# Patient Record
Sex: Female | Born: 1985 | Race: White | Hispanic: No | Marital: Single | Smoking: Current every day smoker
Health system: Southern US, Community
[De-identification: ages and names within clinical notes are randomized; demographics above are authoritative.]

## PROBLEM LIST (undated history)

## (undated) DIAGNOSIS — F1910 Other psychoactive substance abuse, uncomplicated: Secondary | ICD-10-CM

## (undated) DIAGNOSIS — B1920 Unspecified viral hepatitis C without hepatic coma: Secondary | ICD-10-CM

## (undated) DIAGNOSIS — K219 Gastro-esophageal reflux disease without esophagitis: Secondary | ICD-10-CM

## (undated) HISTORY — PX: APPENDECTOMY: SHX54

---

## 2012-01-06 ENCOUNTER — Ambulatory Visit (HOSPITAL_BASED_OUTPATIENT_CLINIC_OR_DEPARTMENT_OTHER): Payer: No Typology Code available for payment source | Admitting: Internal Medicine

## 2012-01-06 ENCOUNTER — Encounter (HOSPITAL_BASED_OUTPATIENT_CLINIC_OR_DEPARTMENT_OTHER): Payer: Self-pay | Admitting: Internal Medicine

## 2012-01-06 VITALS — BP 110/70 | HR 76 | Temp 98.0°F | Ht 65.0 in | Wt 168.0 lb

## 2012-01-06 DIAGNOSIS — F1910 Other psychoactive substance abuse, uncomplicated: Secondary | ICD-10-CM

## 2012-01-06 DIAGNOSIS — Z13220 Encounter for screening for lipoid disorders: Secondary | ICD-10-CM

## 2012-01-06 DIAGNOSIS — Z113 Encounter for screening for infections with a predominantly sexual mode of transmission: Secondary | ICD-10-CM

## 2012-01-06 DIAGNOSIS — K219 Gastro-esophageal reflux disease without esophagitis: Secondary | ICD-10-CM

## 2012-01-06 DIAGNOSIS — Z0000 Encounter for general adult medical examination without abnormal findings: Secondary | ICD-10-CM

## 2012-01-06 DIAGNOSIS — F17200 Nicotine dependence, unspecified, uncomplicated: Secondary | ICD-10-CM

## 2012-01-06 DIAGNOSIS — B1920 Unspecified viral hepatitis C without hepatic coma: Secondary | ICD-10-CM

## 2012-01-06 DIAGNOSIS — F1120 Opioid dependence, uncomplicated: Secondary | ICD-10-CM

## 2012-01-06 MED ORDER — BUPROPION HCL ER (SR) 100 MG PO TB12
100.0000 mg | ORAL_TABLET | Freq: Two times a day (BID) | ORAL | Status: DC
Start: 2012-01-06 — End: 2012-10-04

## 2012-01-06 MED ORDER — OMEPRAZOLE 40 MG PO CPDR
40.0000 mg | DELAYED_RELEASE_CAPSULE | Freq: Every day | ORAL | Status: DC
Start: 2012-01-06 — End: 2012-05-16

## 2012-01-06 NOTE — Patient Instructions (Signed)
Tips for healthy living  Please read the following.  As always, you can call our office with any questions.     One of the greatest risks to the health of someone in your age group is motor vehicle accidents.  NEVER text while driving- It's illegal and dangerous. If you need to put your phone in the backseat when you are driving, DO IT!  Talking while driving even with a hands free can be dangerous. Try to avoid it.  You should always wear your seat belt. Never drink and drive or ride in a car where the driver has been drinking. Your life is worth far more than the cost of a cab.     Eat 5-7 servings of fruits and vegetables each day. Try to choose whole grain bread, pasta and brown rice over white bread, pasta and rice. The extra fiber is good for your digestion and your blood sugars and will help you feel full longer. Avoid fast food and fried food. Limit juice- it's just sugar which your body doesn't need.  If you want something sweet, try the actual fruit.     To maintain a healthy weight, you should try to get moderate exercise at least 30 minutes 5 times per week or 150 minutes per week. This can be broken up throughout your day by walking or taking the stairs. Consider small steps like parking farther away when you go to the store or getting off the bus 1 stop early and walking.    To lose weight, most people need to exercise at least 60 minutes 5 days per week. This must be combined with controlling your calorie intake.  Simple changes you can make include eating more fruits and vegetables (at least 5 cups daily), not drinking your calories (this means no soda or fruit juice), and limiting fatty or fried foods and sweets.    For more tips on healthy eating, go to http://www.choosemyplate.gov/.  We also have nutritionists here in the clinic, please ask if you would like a referral to one.    Use alcohol only in moderation. One serving of alcohol is equivalent to 12 oz beer, 5 oz wine, and 1.5 oz of 80 proof  liquor .  Moderate drinking which has low risk for alcohol problems is defined as 1 drink per day in women or 2 in men.  Heavy drinking with risk for alcohol problems in women is greater than 7 drinks per week or 3 drinks per occasion. In men, this is greater than 14 drinks per week of 4 per occasion.  Binge drinking is dangerous. It is defined as 4 or more drinks in a sitting for women and 5 or more drinks in a sitting for men.  Avoid it!    Wear sunscreen, at least SPF 15 but preferably 30 especially when you are in the sun for extended periods.  Also, to avoid aging on your face like wrinkles and dark spots, you should wear SPF 15 daily. Consider a hat.    Vitamin D is important. It comes from supplemented dairy foods as well as sun exposure. It is important for bone health and a number of other bodily functions which the medical community is learning about every day. In [place] in the winter, most of us become vitamin D deficient.  (I'm sure you have noticed the lack of sunlight).     The recommended daily intake of vitamin D is 600 IU. If you take in at least 4   servings of dairy daily, you are probably getting enough.  If not, consider a vitamin D supplement especially in the winter months.    If you bike, always wear a helmet.    Use a Condom. If you have unprotected sex, remember over the counter emergency contraception (plan B) is available at your pharmacy in [place]. The sooner you take it, the better but it can be effective up to 3 days after unprotected sex to prevent pregnancy.  If you have any questions about birth control, please ask Dr. Adoria Kawamoto. If you are planning a pregnancy, please ask her about what you should do to prepare for pregnancy.    Ticks in [place] can carry Lyme Disease. If you are bitten by a tick and aren't sure how long it's been on you or think it might have been on you more than 12 hours, call our office to discuss whether an antibiotic should be prescribed to you to  prevent Lyme Disease.     Make sure you have smoke detectors with adequate batteries in your home. Consider carbon monoxide detectors if you live in an older home.      You should get regular dental care every 6 months. Gum disease is very common and may be associated with a risk for heart disease.     Protect yourself against domestic violence. If you feel threatened by another person at your home, workplace or elsewhere, please let Dr. Jovonta Levit know, or for more information, contact the National Domestic Abuse Hotline (1-800-799- SAFE).

## 2012-01-06 NOTE — Progress Notes (Signed)
Sara Blair is a 26 year old female who presents with the following problems:  Looking to be established care.     Feels she needs antiacid. Notes raging heart burn. Notes that she has been on antiacids in the past. Unsure what the name is. Happens with anything she eats.   No vomiting, No blood in stools. Had normal EGD years ago with H pylori and received treatment. Significantly worse with trigger foods.     Would like to stop smoking. Interested in Wellbutrin.   Has been on it in the past. Some difficulty with sleep. Does note occasional tearfulness, hopeless, No SI. No hx hospitalization.     Hx hep C, dx 2008 in first detox. Genotype 1a. Doesn't know much beyond this. Does note initially had VL 3 million last checked 3k.     Hx nodules on her lungs, CT done in [facility] [place]. Last 1 year ago.       ROS:  Constitutional: no weight loss, fevers  Eyes: no visual disturbance  HEENT: denies HA, swallowing difficulties, tinnitus, sinus congestion  Cor: no chest pains or palps  Pulm: no SOB, cough  GI: no diarrhea, constipation, change in bowel habit, abd pain as above  MSK: no joint pains or swelling  Psych: as above  Neuro: no focal deficits  Skin: no rashes, lesions  All other systems were reviewed and negative      Past medical history/Past surgical History  Noted in Epic    Social History Narrative  Noted in Epic    Family History Narrative  Noted in Epic      Review of Patient's Allergies indicates:  Not on File        Current Outpatient Prescriptions on File Prior to Visit:  buPROPion (WELLBUTRIN SR) 100 MG 12 hr tablet Take 1 tablet by mouth 2 (two) times daily. Disp: 60 tablet Rfl: 3     No current facility-administered medications on file prior to visit.    OBJECTIVE:  BP 110/70   Pulse 76   Temp(Src) 98 F (36.7 C) (Oral)   Ht 5\' 5"  (1.651 m)   Wt 168 lb (76.204 kg)   BMI 27.96 kg/m2   SpO2 99%   LMP 12/29/2011  General: seated comfortable in NAD, alert, pleasant and  cooperative  Eyes: Sclera anicteric.  HEENT: Mucous membranes moist. Neck supple. No cervical lymphadenopathy. OP nonerythematous without lesions. No tonsillar exudates. External ear canal without erythema. TM intact and clear.   Cardiac: Heart regular rate rhythm. Normal S1 S2. Without murmur gallop or rub. No clubbing cyanosis or edema of extremities. DP and PT pulses +2.   Pulm: Patient speaking in full sentences. Lungs clear to auscultation.   Abd: Abdomen soft, some epigastric tendernous, non distended. No masses. No hepatosplenomegaly noted.    Ext: Lower extremities without lesions. No erythema or deformity noted.       A/P:  (V70.0) Routine general medical examination at a health care facility  (primary encounter diagnosis)  Comment: Given healthy living hand out. Discussed key points.    (070.70) Unspecified viral hepatitis C without hepatic coma  Plan: HEPATITIS C BDNA QUAL TO QUANT, HEPATITIS C         VIRUS GENOTYPING, HEPATIC FUNCTION PANEL,         REFERRAL TO INFECTIOUS DISEASES ( INT),         HEPATITIS A AB TOTAL, HEP B SURFACE ANTIBODY,         COLLECTION  VENOUS BLOOD VENIPUNCTURe    (305.90) Polysubstance abuse  (304.00) Opiate dependence  Comment: patient notes active in NA looking for sponsor.   Plan: monitor    (530.81) Esophageal reflux  Comment: will give 8 wk trial of ppi then attempt to transition to H2 blocker.   Plan: omeprazole (PRILOSEC) 40 MG capsule    (305.1) Tobacco use disorder  Comment: discussed preparation to quit. Patient very motivated. Discussed SE from SSRI including length till efficacy, irritability, suicide risk.   Plan: buPROPion (WELLBUTRIN SR) 100 MG 12 hr tablet    (V77.91) Lipid screening  Plan: LIPID PANEL, COLLECTION VENOUS BLOOD         VENIPUNCTURe    (V74.5) Screen for STD (sexually transmitted disease)  Plan: HIV 1 AND 2 PLUS O ANTIBODY, AMPLIFIED GENPROBE        CHLAM/GC, COLLECTION VENOUS BLOOD VENIPUNCTURE    Return to Clinic:  In 1 months for tobacco  cessation follow up or sooner if problems arise.       I have spent 25 minutes in face to face time with this patient/patient proxy of which > 50% was in counseling or coordination of care regarding above issues/Dx. Above physical exam.

## 2012-01-09 LAB — CHLAMYDIA GC NAAT
GENPROBE CHLAMYDIA: NEGATIVE
GENPROBE GC: NEGATIVE

## 2012-01-10 LAB — HEPATITIS C VIRUS GENOTYPING: HEPATITIS C VIRUS GENOTYPE: 3

## 2012-01-12 ENCOUNTER — Telehealth (HOSPITAL_BASED_OUTPATIENT_CLINIC_OR_DEPARTMENT_OTHER): Payer: Self-pay | Admitting: Registered Nurse

## 2012-01-12 NOTE — Progress Notes (Signed)
Sara Blair - Test results, ','<<< Less Detail          Test results,   Sara Blair  MRN: [id number] DOB: 09/17/1985      Pt Home: [phone]      Sent: Thu January 12, 2012 12:29 PM   Entered: [phone]      To: P Shpc Rn'S                               Message      Sara Blair is a 26 year old old female.        Patient's PCP: STAHL,BONNI MD        In case we get disconnected what is the best number to reach you at today:    Other [phone]        Person calling:    Patient (self)        How can I help you today:     Calling checking if test results are back.         Patient's language of care: English        Would you like an interpreter when the nurse calls you back?    NO   01/12/12  1237 pm  Pt called asking for recent  Lab results  Message sent  To PCP

## 2012-01-12 NOTE — Progress Notes (Signed)
Patient called informed of lab results.   Remembers being type 1 a for hcv.   Still pending viral load.

## 2012-01-13 ENCOUNTER — Encounter (HOSPITAL_BASED_OUTPATIENT_CLINIC_OR_DEPARTMENT_OTHER): Payer: Self-pay | Admitting: Internal Medicine

## 2012-01-13 DIAGNOSIS — R918 Other nonspecific abnormal finding of lung field: Secondary | ICD-10-CM | POA: Insufficient documentation

## 2012-01-19 LAB — HEPATITIS B SURFACE ANTIBODY: HEPATITIS B SURFACE ANTIBODY: REACTIVE

## 2012-01-19 LAB — CHG HEPATIC FUNCTION PANEL
ALANINE AMINOTRANSFERASE: 31 IU/L (ref 7–35)
ALBUMIN: 4.6 g/dl (ref 3.4–4.8)
ALKALINE PHOSPHATASE: 52 IU/L (ref 25–106)
ASPARTATE AMINOTRANSFERASE: 30 IU/L (ref 8–34)
BILIRUBIN DIRECT: 0.1 mg/dl (ref 0.0–0.2)
BILIRUBIN TOTAL: 0.6 mg/dl (ref 0.2–1.1)
INDIRECT BILIRUBIN: 0.5 mg/dl (ref 0.2–0.9)
TOTAL PROTEIN: 7.4 g/dl (ref 5.9–7.5)

## 2012-01-19 LAB — CHG LIPID PANEL
Cholesterol: 201 mg/dl — ABNORMAL HIGH (ref 0–200)
HIGH DENSITY LIPOPROTEIN: 52 mg/dl (ref 35–85)
LOW DENSITY LIPOPROTEIN DIRECT: 124 mg/dl — ABNORMAL HIGH (ref 0–100)
RISK FACTOR: 3.9 (ref ?–4.4)
TRIGLYCERIDES: 80 mg/dl (ref 0–150)

## 2012-01-19 LAB — HEPATITIS A ANTIBODY TOTAL: HEPATITIS A ANTIBODY TOTAL: NONREACTIVE

## 2012-01-19 LAB — HEPATITIS C BDNA QUANTITATIVE
HEPATITIS C BDNA QUANT LOG: 3.58 LOG10/ml
HEPATITIS C BDNA QUANTITATIVE: 3798 IU/mL

## 2012-01-19 LAB — HIV 1 AND 2 PLUS O ANTIBODY: HIV 1 AND 2 PLUS O SCREEN: NONREACTIVE

## 2012-01-19 LAB — HEPATITIS C BDNA QUAL TO QUANT: HEPATITIS C ANTIBODY QUAL: POSITIVE — AB

## 2012-02-09 ENCOUNTER — Ambulatory Visit (HOSPITAL_BASED_OUTPATIENT_CLINIC_OR_DEPARTMENT_OTHER): Payer: No Typology Code available for payment source | Admitting: Infectious Diseases

## 2012-02-10 ENCOUNTER — Ambulatory Visit (HOSPITAL_BASED_OUTPATIENT_CLINIC_OR_DEPARTMENT_OTHER): Payer: No Typology Code available for payment source | Admitting: Internal Medicine

## 2012-03-01 ENCOUNTER — Ambulatory Visit (HOSPITAL_BASED_OUTPATIENT_CLINIC_OR_DEPARTMENT_OTHER): Payer: No Typology Code available for payment source | Admitting: Internal Medicine

## 2012-04-22 ENCOUNTER — Emergency Department (HOSPITAL_BASED_OUTPATIENT_CLINIC_OR_DEPARTMENT_OTHER)
Admission: RE | Admit: 2012-04-22 | Disposition: A | Discharge status: Home / Self Care | Payer: Self-pay | Source: Emergency Department | Attending: Emergency Medicine | Admitting: Emergency Medicine

## 2012-04-22 LAB — URINALYSIS
BILIRUBIN, URINE: NEGATIVE
CASTS: NONE SEEN PER LPF
CRYSTALS: NONE SEEN
GLUCOSE, URINE: NEGATIVE MG/DL
KETONE, URINE: NEGATIVE MG/DL
NITRITE, URINE: NEGATIVE
OCCULT BLOOD, URINE: NEGATIVE
PH URINE: 7 (ref 5.0–8.0)
PROTEIN, URINE: NEGATIVE MG/DL
SPECIFIC GRAVITY URINE: 1.01 (ref 1.003–1.035)

## 2012-04-22 LAB — POC URINALYSIS
BILIRUBIN, URINE: NEGATIVE
GLUCOSE,URINE: NEGATIVE
KETONE, URINE: NEGATIVE
NITRITE, URINE: NEGATIVE
OCCULT BLOOD, URINE: NEGATIVE
PH URINE: 7 (ref 5.0–8.0)
PROTEIN, URINE: NEGATIVE
SPECIFIC GRAVITY, URINE: 1.01 (ref 1.003–1.030)
UROBILINOGEN URINE: 0.2 (ref 0.2–1.0)

## 2012-04-22 LAB — URINE PREGNANCY TEST (POINT OF CARE): HCG QUALITATIVE URINE: NEGATIVE

## 2012-04-22 MED ORDER — NITROFURANTOIN MONOHYD MACRO 100 MG PO CAPS
100.00 mg | ORAL_CAPSULE | Freq: Two times a day (BID) | ORAL | Status: AC
Start: 2012-04-22 — End: 2012-04-29

## 2012-04-22 MED ORDER — FLUCONAZOLE 100 MG PO TABS
150.00 mg | ORAL_TABLET | Freq: Once | ORAL | Status: AC
Start: 2012-04-22 — End: 2012-04-22

## 2012-04-22 MED ORDER — PHENAZOPYRIDINE HCL 100 MG PO TABS
100.00 mg | ORAL_TABLET | Freq: Three times a day (TID) | ORAL | Status: AC | PRN
Start: 2012-04-22 — End: 2012-04-29

## 2012-04-22 NOTE — ED Notes (Signed)
Patient Disposition    Patient education for diagnosis, medications and follow-up.  Patient left ED 11:12 PM.  Patient received written instructions as well as prescriptions for Macrobid, Pyridium and Diflucan.  All were reviewed with good understanding.  Interpreter to provide instructions: No    Discharged to: Discharged to home

## 2012-04-22 NOTE — ED Triage Note (Signed)
Urinary symptoms x 1 week. No fevers, denies n/v. States no relief with otc meds

## 2012-04-22 NOTE — ED Notes (Signed)
Report and care to Diane RN

## 2012-04-22 NOTE — ED Provider Notes (Addendum)
ED nursing record was reviewed. Prior records as available electronically through the Epic record were reviewed.    Patient was seen along with Dr. Ane Payment and management was discussed with them.    HPI:    This 26 year old female patient presents to the Emergency Department with chief complaint of urinary tract infection.  Patient states she has had one week of urinary burning, with suprapubic tenderness, and that she has been treating with AZO.  She states it is similar to previous urinary tract infections that she has had.  She denies any fevers or chills.  She denies any vaginal bleeding, however states that she has a white vaginal discharge x2 days.  She states that her last menstrual period was September 3 and was normal.  She states that she is currently sexually active with men in a monogamous relationship sometimes using condoms for prophylaxis.  She has no current concern for STI.  She has no fevers, chills, abdominal pain, nausea, vomiting, diarrhea, constipation, other systemic symptoms or complaints.  Patient did ask for a candidate for a cut on her right thumb.  I looked at the injury, cleaned it, noting a shallow clean skin laceration are dressed the injury with a Steri-Strip and a new Band-Aid.    Past Medical History/Problem list:  No past medical history on file.  Patient Active Problem List:     Unspecified viral hepatitis C without hepatic coma     Opiate dependence     Polysubstance abuse     Tobacco use disorder     Multiple lung nodules        Past Surgical History: No past surgical history on file.      Medications:   No current facility-administered medications on file prior to encounter.  Current Outpatient Prescriptions on File Prior to Encounter:  EXPIRED: buPROPion (WELLBUTRIN SR) 100 MG 12 hr tablet Take 1 tablet by mouth 2 (two) times daily. Disp: 60 tablet Rfl: 3         Social History:   Social History   Marital Status: Single  Spouse Name: N/A    Years of Education: N/A   Number of Children: N/A     Occupational History  None on file     Social History Main Topics   Smoking status: Current Everyday Smoker  1.00 Packs/Day  For 9.00 Years     Types: Cigarettes    Smokeless tobacco:     Alcohol Use: Yes    Comment: currently clean, Etoh up to couple bottle, pint a day.     Drug Use: Yes     Comment: started at age 34 IVDU since then multiple Detoxs. Clean 60 days. prior relapse. Not currently on opiate therapy. opiates, coke mainly.      Sexually Active: Not Currently  Partner(s): Female    Comment: not since feb      Other Topics Concern   None on file     Social History Narrative    Living [facility] half way house.                    Allergies:  Review of Patient's Allergies indicates:   Penicillins             Rash      Physical Exam:  BP 142/72   Pulse 82   Temp(Src) 98.2 F   Resp 18   SpO2 100%   LMP 04/03/2012    GENERAL: Well appearing,  No acute distress, non-toxic.    SKIN:  Warm & Dry, no erythema or rash.  HEAD:  NCAT. Sclerae are anicteric and aninjected, oropharynx is clear with moist mucous membranes. PERRL. EOMI.   LUNGS:  Clear to auscultation bilaterally. No wheezes, rales, rhonchi.   HEART:  RRR.  No murmurs, rubs, or gallops.   ABDOMEN:  Soft, NTND.  No hepatosplenomegaly.  No masses.  No involuntary guarding or rebound.   EXTREMITIES:  No obvious deformities.       Pelvic exam: exam chaperoned by nurse,EGBUS within normal limits,normal cervix without lesions with mild light cottagecheese like discharge., polyps or tenderness,uterus normal size, shape, consistency, no mass or tenderness,adnexa normal in size without mass or tenderness.  GENITOURINARY:  No CVA tenderness.   NEUROLOGIC:  Alert and oriented x4; moves all extremities well; speaking in clear fluent sentences. PSYCHIATRIC:  Appropriate for age, time of day, and situation           ED Course and Medical Decision-making:  This 26 year old female patient who presents to the emergency department with symptoms  of a urinary tract infection times one week, with vaginal discharge x2 days.  Patient has no fevers chills or costovertebral angle tenderness, and no abdominal symptoms.  Her pelvic exam was remarkable for a thick white discharge that was non-malodorous w/ no cervical motion tenderness.  Her urine dip showed trace leuks her urine was sent for culture and sensitivity.  She was treated for her yeast infection Diflucan, and will be treated with Macrobid for her UTI.  She will followup with her primary care physician if her symptoms continue over the next 5 days.  2 return to the emergency department if she experiences any chills, fever, worsening symptoms, or new symptoms.      Condition: Unchanged Stable  Disposition: Home      Diagnosis/Diagnoses:  UTI (lower urinary tract infection)  (primary encounter diagnosis)  Yeast infection    Gilmore Laroche, PA-C    I performed a history and physical examination of the patient and discussed his management with the PA. I reviewed the PA's note and agree with the documented finding and plan of care except as noted below.

## 2012-04-22 NOTE — Discharge Instructions (Signed)
DIAGNOSIS & TREATMENT:  You were seen in a [facility] Emergency Department for Urinary Tracit Infection (UTI) and yeast infection.  TEST RESULTS:   You urine demonstrated infection. A Culture is being sent to our lab to make sure the antibiotics you were prescribed are effective against the bacterial infection you have. If not the hospital will call you with further instructions.    FURTHER CARE:  Take the pyridium as prescribed for the first 3 days. Take the antibiotic and finish out the full prescription even if you are feeling better.    NEW MEDICATIONS:  Macrobid:  This is an antibiotic which is only effective against bacterial infections, not viral. Please take this medication as directed and finish out the entire course of the medication. Failure to do this can cause resistance and not only allow your infection to return but also make it harder to treat the next time.  Diflucan: This is an antifungal medication for the treatment of your yeast infection please take once.  Pyridium (phenazopyridine): This medication is to reduce the pain you are having while urinating. This medication can stain clothes and contact lenses. Please wash you hands thoroughly after handling the pills.    WHEN SHOULD YOU BE SEEN NEXT?  Please call your doctor and be seen with in the next 5-10 days for re-evaluation if symptoms are not improving.    WHEN SHOULD YOU RETURN TO THE ED?  Please return to the emergency room if you have fever that does resolve with tylenol or ibuprofen, you develop flank pain, your pain increases, your symptoms worsen, you get new symptoms or you are unable to schedule follow up care.

## 2012-04-23 LAB — CHLAMYDIA GC NAAT
GENPROBE CHLAMYDIA: NEGATIVE
GENPROBE GC: NEGATIVE

## 2012-04-25 LAB — URINE CULTURE/COLONY COUNT

## 2012-05-16 ENCOUNTER — Ambulatory Visit (HOSPITAL_BASED_OUTPATIENT_CLINIC_OR_DEPARTMENT_OTHER): Payer: No Typology Code available for payment source | Admitting: Internal Medicine

## 2012-05-16 ENCOUNTER — Encounter (HOSPITAL_BASED_OUTPATIENT_CLINIC_OR_DEPARTMENT_OTHER): Payer: Self-pay | Admitting: Internal Medicine

## 2012-05-16 VITALS — BP 120/70 | HR 60 | Temp 98.2°F | Wt 161.0 lb

## 2012-05-16 DIAGNOSIS — F1120 Opioid dependence, uncomplicated: Secondary | ICD-10-CM

## 2012-05-16 DIAGNOSIS — Z309 Encounter for contraceptive management, unspecified: Secondary | ICD-10-CM

## 2012-05-16 DIAGNOSIS — L708 Other acne: Secondary | ICD-10-CM

## 2012-05-16 DIAGNOSIS — R300 Dysuria: Secondary | ICD-10-CM

## 2012-05-16 DIAGNOSIS — K219 Gastro-esophageal reflux disease without esophagitis: Secondary | ICD-10-CM

## 2012-05-16 LAB — URINE DIP (POINT OF CARE)
BILIRUBIN, URINE: NEGATIVE
GLUCOSE, URINE: NEGATIVE mg/dl
KETONE, URINE: NEGATIVE mg/dl
LEUKOCYTE ESTERASE: NEGATIVE
NITRITE, URINE: NEGATIVE
OCCULT BLOOD, URINE: NEGATIVE
PH URINE: 6 (ref 5.0–8.0)
PROTEIN, URINE: NEGATIVE mg/dl (ref 0–15)
SPECIFIC GRAVITY URINE: 1.02 (ref 1.003–1.030)
UROBILINOGEN URINE: 0.2 mg/dl (ref 0.2–1.0)

## 2012-05-16 LAB — URINE PREGNANCY TEST (POINT OF CARE): HCG QUALITATIVE URINE: NEGATIVE

## 2012-05-16 MED ORDER — TRAZODONE HCL 50 MG PO TABS
50.0000 mg | ORAL_TABLET | Freq: Every evening | ORAL | Status: DC
Start: 2012-05-16 — End: 2012-11-22

## 2012-05-16 MED ORDER — OMEPRAZOLE 40 MG PO CPDR
40.0000 mg | DELAYED_RELEASE_CAPSULE | Freq: Every day | ORAL | Status: AC
Start: 2012-05-16 — End: 2012-07-16

## 2012-05-16 MED ORDER — CLINDAMYCIN PHOSPHATE 1 % EX LOTN
TOPICAL_LOTION | Freq: Two times a day (BID) | CUTANEOUS | Status: AC
Start: 2012-05-16 — End: 2013-05-16

## 2012-05-16 NOTE — Progress Notes (Signed)
Sara Blair is a 26 year old female here for evaluation of urinary symptoms, GERD, contraception, substance abuse and acne.      She says just needs refills on antiacid.  Uses tums.   Uses omeprazole daily for her GERD. She says that gets heartburn all the time.  Burps "bile".  Is controlled by using ppi daily.  Has not used for a little while and symptoms are back.       She was treated for UTI less than one month ago.  She did not complete the course of macrobid given at the ED few weeks ago.  She did use for at least 4-5 days. She then started having right lower back pain, which is now resolved.  Sometimes still feels dysuria.  She has a history of frequent UTIs after sexual activity. Now became sexually active with new partner.      She is sexually active with stable partner for past 3 months.  Used OCP before and wants to restart.  She is smoking.  Has cut down to 1/2 ppd or less.  Sometimes gets headaches. No aura.  No vision changes.  No paresthesias.  No personal or family of blood clots.   LMP 04/26/12.     Reports a history of substance abuse.  Recently had a "slip".  Has been going to NA meetings again.  Has tried to use low dose of suboxone from the street to help alleviate her symptoms.  Is feeling positive about her recovery process and feels well connected with her group.  Does not want to go to groups at our site.  Will like help with medication to help her sleep for now.      Wants refills on clindamycin topical for her acne over her back and chest.            Physical Exam   Vitals reviewed.  Constitutional: She is oriented to person, place, and time. She appears well-developed. No distress.   Abdominal: Soft. Normal appearance. There is no tenderness. There is no CVA tenderness.   Neurological: She is alert and oriented to person, place, and time.   Skin:   Few open and closed comedones over anterior upper chest and upper back.  No cystic lesions.     Psychiatric: She has a normal mood and  affect. Her behavior is normal.         ASSESSMENT/PLAN:  (788.1) Dysuria  (primary encounter diagnosis)  Comment: in the setting of recent UTI treated partially.  Her UA is negative today so will send U Cx before starting new antibiotics. Advised to drink lots of fluids for now.  Return instructions provided.  Discussed also post coital antibiotic if UTI is an option if needed.   She will return prn.     Plan: URINE CULTURE/COLONY COUNT    (530.81) Esophageal reflux  Plan: refill omeprazole (PRILOSEC) 40 MG capsule       To take daily for 2 weeks then prn.      (V25.9) Contraception management  Comment: we discussed risks of combo OCP including thromboembolic.  She is smoking,  She is under the age of 89 so using estrogen-progesterone OCP will not be contraindicated.  Will refer to our family counselor to restart combo OCP.    Plan: URINE PREGNANCY TEST (POINT OF CARE)    (304.00) Opiate dependence  Comment: discussed importance of adequate follow up.  She agrees to continue regular meetings.  She declines OAS.  Plan: short term for her insomnia-  traZODone (DESYREL) 50 MG tablet    (706.1) Other acne  Comment: has had rx with topical clindamycin which helps her chest and back acne.  No cystic acne.    Plan: refill clindamycin (CLEOCIN T) 1 % lotion

## 2012-05-17 LAB — URINE CULTURE/COLONY COUNT: URINE CULTURE/COLONY COUNT: NO GROWTH

## 2012-08-03 ENCOUNTER — Ambulatory Visit (HOSPITAL_BASED_OUTPATIENT_CLINIC_OR_DEPARTMENT_OTHER): Payer: No Typology Code available for payment source | Admitting: Internal Medicine

## 2012-08-09 ENCOUNTER — Ambulatory Visit (HOSPITAL_BASED_OUTPATIENT_CLINIC_OR_DEPARTMENT_OTHER): Payer: No Typology Code available for payment source

## 2012-08-09 ENCOUNTER — Encounter (HOSPITAL_BASED_OUTPATIENT_CLINIC_OR_DEPARTMENT_OTHER): Payer: Self-pay

## 2012-08-09 ENCOUNTER — Encounter (HOSPITAL_BASED_OUTPATIENT_CLINIC_OR_DEPARTMENT_OTHER): Payer: Self-pay | Admitting: Internal Medicine

## 2012-08-09 VITALS — BP 102/62

## 2012-08-09 DIAGNOSIS — Z3009 Encounter for other general counseling and advice on contraception: Secondary | ICD-10-CM

## 2012-08-09 LAB — URINE PREGNANCY TEST (POINT OF CARE): HCG QUALITATIVE URINE: NEGATIVE

## 2012-08-09 NOTE — Progress Notes (Signed)
Hi Dr. Eppie Gibson, I saw this patient today at [facility]. She wants to use birth control pills. She used in past and liked it. She has been using only condoms in the past year. Pregnancy test negative. Blood pressure 102/62. She had unprotected sex recently but she is expecting her period on this weekend. She is 27 yo, a smoker but she has been using the E-cigarettes nowadays. I suggested her to wait for her period and start the pills within the 05 days of her period. Use condoms at least in first two weeks when she starts the pills. Could you please give put a prescription for Ortho Tri-Cyclen at [facility] in [place]? Thank you, Bobbe Medico - Family Planning Counselor    Will send in rx for ortho tricyclin lo given patient smoker.

## 2012-08-09 NOTE — Progress Notes (Signed)
Reason for Visit: Patient is here to discuss about birth control    Confidential Visit: no  Safe or Confidential Phone #: [phone]  Preferred Name: no    History:    Changes or New Prescriptions: no  Over the Counter or Herbal Medications: no    Changes to Allergies: no    Substance Use:     Changes to Substance Use: no    GYN:  LMP: 07/13/13  Last sexual activity: 2 days ago  Gravida:  01   Para: 0 SAB: 0    TAB: 01   Ectopic: 0  Last GYN Exam (date/result): 08/2011 with negative result    Since LMP:  Bleeding:  no  Discharge:  No  Cramps:  No  Abdominal Pain: No  STI Exposure: No    Pregnancy Desired: no  Support Systems? Boyfriend  Feelings/Plan:     DV Assessment:  Hx of DV/IP Abuse:  no  Hx of Sexual Assault:  No  Sexual Coercion: No  Birth Control Coercion:No  Received Money, Food, Shelter, or Drugs for Sex: No    Social History:  Home Life: Lives with her boyfriend  Work: Advertising copywriter  School: no    Sexual History:  Current Partner(s): Boyfriend for 06 months  Current Sexual Activity: Female  Vaginal, Oral  Current Method of Contraception:   Programme researcher, broadcasting/film/video (s) - Current Method of Contraception: condoms  Sexual Concerns or Questions: no    History of STIs:    Tested Before?:  yes  Component      Latest Ref Rng 01/06/2012   GENPROBE GC       Negative for Neisseria gonorrhoeae rRNA.   GENPROBE CHLAMYDIA       Negative for Chlamydia trachomatis rRNA.   HEPATITIS C BDNA QUANTITATIVE       3798   HEPATITIS C BDNA QUANT LOG       3.58   HIV 1 AND 2 PLUS O SCREEN      NONREACTIVE NON-REACTIVE   HEPATITIS A AB TOTAL IGG/IGM      NONREACTIVE NON-REACTIVE   HEPATITIS B SURFACE ANTIBODY      NONREACTIVE REACTIVE     Testing Offered: Yes  Testing Accepted: No   -Disc: Transmission (Blood, Semen, Vaginal Fluid and Breastmilk), Risky Behaviors, Non-Risks/Myths,  Seroconversion Period , Result Waiting Time, Possible Results, Feelings Surrounding Possible Results, Plans for Informing of Results, Support Structures and Hep C  Risk.    Education/Counseling:  Contraceptive overview: Risks, Benefits, Warning Signs, Effectiveness, Side Effects, Instructions, Follow Up  Informed Consent signed for:  Oral Contraceptives  STD/HIV Risk Assessment Done  STD/HIV prevention/safer sex discussed  Condoms offered and  Distributed  Emergency Contraception Discussed  Confidentiality  Handouts given    Plan:  Options counseling discussed including parenting, termination, and adoption.   Testing Done: Yes: Pregnancy test  Prescriptions Done: Yes: OCPs  Patient plan: To use OCPs    Follow Up Plan:   Patient came here for birth control. She lives with her boyfriend. They have been together for 06 months.  Patient used Nuvaring in the past for one month and did not liked it. She also used pills for a few years. She has not used any birth control for around one year beside condoms.   We discussed all birth control and patient decided to go back to the pills. She has some acne and she would like some type that could help her with it.  Blood pressure taken today and was  102/62. Pregnancy test performed with negative result. Patient had unprotected sex 02 days ago. I offered Emergency Contraception and she declined. She is expecting her period in the end of this week and according to her she is already having some symptoms on her body about the period.  Suggested her to repeat the pregnancy test or return to the Clinic in case she misses her period. She agreed.  I will send a message to her doctor and ask her to put the prescription for [facility] in [place]. (sent through staff message in Epic)    Bobbe Medico - [facility].

## 2012-08-10 ENCOUNTER — Telehealth (HOSPITAL_BASED_OUTPATIENT_CLINIC_OR_DEPARTMENT_OTHER): Payer: Self-pay | Admitting: Internal Medicine

## 2012-08-10 ENCOUNTER — Telehealth (HOSPITAL_BASED_OUTPATIENT_CLINIC_OR_DEPARTMENT_OTHER): Payer: Self-pay | Admitting: Registered Nurse

## 2012-08-10 MED ORDER — NORETHINDRONE ACET-ETHINYL EST 1-20 MG-MCG PO TABS
1.0000 | ORAL_TABLET | Freq: Every day | ORAL | Status: DC
Start: 2012-08-10 — End: 2012-10-04

## 2012-08-10 MED ORDER — NORGESTIM-ETH ESTRAD TRIPHASIC 0.18/0.215/0.25 MG-25 MCG PO TABS
1.0000 | ORAL_TABLET | Freq: Every day | ORAL | Status: DC
Start: 2012-08-10 — End: 2012-08-10

## 2012-08-10 NOTE — Progress Notes (Signed)
Request from pharmacy for birth control pills  Pt was seen 08/09/12 by family planning   Discussed BC options  Pt requesting to start on oral birth control - details in note 08/09/12    Will forward to PCP to review

## 2012-08-10 NOTE — Progress Notes (Signed)
Ortho Tri Cyclen Lo requires a prior authorization. Please fill out the following information below and route back to the [facility] pool.     Prior authorization request for Ortho Tri Cyclen Lo  Clinical Indication:   Previous medications tried:   Diagnostic testing information:       Prior authorization required for medication: Ortho Tri Cyclen Lo  Membership ID (insurance): [id number]   [employer])     if [employer] , Prescription drug plan: N/A

## 2012-08-10 NOTE — Telephone Encounter (Signed)
Message copied by Almira Bar on Fri Aug 10, 2012 11:43 AM  ------       Message from: Marinus Maw       Created: Fri Aug 10, 2012 11:29 AM       Regarding: birth control       Contact: [phone]                       Sara Blair [id number], 27 year old, female, Telephone Information:       Home Phone      [phone]       Work Phone      Not on file.       Mobile          [phone]                     Patient's Preferred Pharmacy:               [id number], [place] - [address] AT [facility] [address]       Phone: [phone] Fax: [phone]                     CONFIRMED TODAY: Yes              CALL BACK NUMBER: [phone]       Best time to call back: anytime       Cell phone:        Other phone:              Available times:              Patient's language of care: English              Patient does not need an interpreter.              Patient's PCP: Alton Revere MD              Person calling on behalf of patient: Pharmacy Joyice Faster)              Calls today for med refill(s). OBGYN dr sent pcp order for birth control- not yet in med list- pharmacy calling for a request                     -  ------

## 2012-08-10 NOTE — Progress Notes (Signed)
Spoke with pt  Informed of script sent  Educated to start the Sunday after she starts her period (started period today so will start (08/12/12)  Educated to take at the same time every day  Educated to use another form of birth control for the first 7 days  Pt verbalized understanding and agrees with plan

## 2012-08-10 NOTE — Progress Notes (Signed)
rx sent this AM. Discussed Ortho tri cyclin with family planning... Sent Ortho tri cyclin lo given patient a smoker and somewhat less clotting risk with lower dose estrogen.

## 2012-08-10 NOTE — Progress Notes (Signed)
[facility] pharmacy called stating that Rx for ORTHO TRI-CYCLEN LO needs Prior Authorization sent to patient's insurance.  Prior authorization request for ORTHO TRI-CYCLEN LO  Clinical Indication:   Previous medications tried:  Diagnostic testing information:

## 2012-08-10 NOTE — Progress Notes (Signed)
Called pt to inform of script sent and to educate on how to take  LM for call back

## 2012-08-10 NOTE — Progress Notes (Signed)
Discussed with pharmacy. Insurance will cover generic. Microgestin. RX sent.

## 2012-08-10 NOTE — Telephone Encounter (Signed)
Message copied by Almira Bar on Fri Aug 10, 2012  1:02 PM  ------       Message from: Otilio Saber       Created: Fri Aug 10, 2012  1:00 PM       Regarding: Returning nurse's phone call.       Contact: [phone]         Bethsaida Siegenthaler is a 27 year old old female.              Patient's PCP: STAHL,BONNI MD              In case we get disconnected what is the best number to reach you at today:       Home Phone [phone] (home)              Person calling:       Patient (self)              How can I help you today:        Other Returning nurse's phone call.               Patient's language of care: English              Would you like an interpreter when the nurse calls you back?       NO         ------

## 2012-08-11 ENCOUNTER — Telehealth (HOSPITAL_BASED_OUTPATIENT_CLINIC_OR_DEPARTMENT_OTHER): Payer: Self-pay | Admitting: Internal Medicine

## 2012-08-11 NOTE — Progress Notes (Signed)
Prior authorization request for ortho-tri-cyclen lo tabs  Clinical Indication:  Previous medications tried:   Diagnostic testing information:

## 2012-08-13 NOTE — Progress Notes (Signed)
Dr Eppie Gibson prescribed alternative medication which is covered under patients insurance .

## 2012-10-04 ENCOUNTER — Encounter (HOSPITAL_BASED_OUTPATIENT_CLINIC_OR_DEPARTMENT_OTHER): Payer: Self-pay | Admitting: Internal Medicine

## 2012-10-04 ENCOUNTER — Ambulatory Visit (HOSPITAL_BASED_OUTPATIENT_CLINIC_OR_DEPARTMENT_OTHER): Payer: No Typology Code available for payment source | Admitting: Internal Medicine

## 2012-10-04 VITALS — BP 118/76 | HR 76 | Temp 98.1°F | Resp 18 | Wt 157.0 lb

## 2012-10-04 DIAGNOSIS — L708 Other acne: Secondary | ICD-10-CM

## 2012-10-04 DIAGNOSIS — J329 Chronic sinusitis, unspecified: Secondary | ICD-10-CM

## 2012-10-04 DIAGNOSIS — F17200 Nicotine dependence, unspecified, uncomplicated: Secondary | ICD-10-CM

## 2012-10-04 DIAGNOSIS — K219 Gastro-esophageal reflux disease without esophagitis: Secondary | ICD-10-CM

## 2012-10-04 DIAGNOSIS — B1920 Unspecified viral hepatitis C without hepatic coma: Secondary | ICD-10-CM

## 2012-10-04 MED ORDER — DESOGESTREL-ETHINYL ESTRADIOL 0.15-30 MG-MCG PO TABS
1.0000 | ORAL_TABLET | Freq: Every day | ORAL | Status: DC
Start: 2012-10-04 — End: 2012-10-31

## 2012-10-04 MED ORDER — FLUTICASONE PROPIONATE 50 MCG/ACT NA SUSP
1.0000 | Freq: Every day | NASAL | Status: DC
Start: 2012-10-04 — End: 2012-11-22

## 2012-10-04 MED ORDER — RANITIDINE HCL 150 MG PO CAPS
150.00 mg | ORAL_CAPSULE | Freq: Two times a day (BID) | ORAL | Status: AC
Start: 2012-10-04 — End: 2013-02-03

## 2012-10-04 MED ORDER — BUPROPION HCL ER (SR) 100 MG PO TB12
100.0000 mg | ORAL_TABLET | Freq: Two times a day (BID) | ORAL | Status: DC
Start: 2012-10-04 — End: 2013-02-15

## 2012-10-04 NOTE — Progress Notes (Signed)
Sara Blair is a 27 year old female who presents with the following problems:    [place]oved into the city.     Works at [employer], Education officer, environmental.     Notes that in stable relationship, needs OCP's. Since starting OCP 2 months ago has had difficulty with acne.   Taken low dose combo pills in the past.     Notes headache behind eye. Notes taking sudafed without relief and saline nose spray.   No fevers.   No cough.   Since last week. Intermittent.   Notes just one cold earlier this year.     Ran out of omeprazole recently and had aweful GERD. Used Omeprazole on and off through out the years. Scope years ago as a teenager with gastritis.    ROS:  Constitutional: no weight loss, fevers  HEENT: as bove  GI: no diarrhea, constipation, change in bowel habit, abd pain  Psych: stable      Past medical history/Past surgical History  Noted in Epic    Social History Narrative  Noted in Epic    Family History Narrative  Noted in Epic      Review of Patient's Allergies indicates:   Penicillins             Rash        Current Outpatient Prescriptions on File Prior to Visit:  buPROPion (WELLBUTRIN SR) 100 MG 12 hr tablet Take 1 tablet by mouth 2 (two) times daily. Disp: 60 tablet Rfl: 3   traZODone (DESYREL) 50 MG tablet Take 1 tablet by mouth nightly. Disp: 30 tablet Rfl: 0   clindamycin (CLEOCIN T) 1 % lotion Apply  topically 2 (two) times daily. Disp: 28 mL Rfl: 6     No current facility-administered medications on file prior to visit.    OBJECTIVE:  BP 118/76   Pulse 76   Temp(Src) 98.1 F (36.7 C) (Oral)   Resp 18   Wt 157 lb (71.215 kg)   BMI 26.13 kg/m2   SpO2 98%   LMP 09/27/2012  General: seated comfortable in NAD, alert, pleasant and cooperative  Eyes: Sclera anicteric.  HEENT: Mucous membranes moist. Neck supple. No cervical lymphadenopathy. OP erythematous without lesions.  Cardiac: Heart regular rate rhythm. Normal S1 S2. Without murmur gallop or rub. No clubbing cyanosis or edema of extremities.   Pulm: Patient  speaking in full sentences. Lungs clear to auscultation.   Abd: Abdomen soft, non tender, non distended. No masses. No hepatosplenomegaly noted.    Skin: Warm, dry and scattered cystic acne on chin.          A/P:  (070.70) Unspecified viral hepatitis C without hepatic coma  (primary encounter diagnosis)  Comment: type 3 will re to ID, possible treatment  Plan: REFERRAL TO INFECTIOUS DISEASES ( INT)    (305.1) Tobacco use disorder  Comment: still using. Continue to address.  Plan: buPROPion (WELLBUTRIN SR) 100 MG 12 hr tablet    (706.1) Other acne  Comment:will try diff progesterone component for better acne control.  Plan: desogestrel-ethinyl estradiol (APRI,         ORTHO-CEPT) 0.15-30 MG-MCG per tablet    (473.9) Unspecified sinusitis (chronic)  Comment: will call if not improved may need abx.  Plan: fluticasone (FLONASE) 50 MCG/ACT nasal spray         (530.81) Esophageal reflux  Comment: likely exacerbated in the setting of ETOH use. Will try taper off PPI.  Plan: ranitidine (ZANTAC) 150 MG capsule  Return to Clinic:  In 3 months, or sooner if problems arise.

## 2012-10-18 ENCOUNTER — Telehealth (HOSPITAL_BASED_OUTPATIENT_CLINIC_OR_DEPARTMENT_OTHER): Payer: Self-pay | Admitting: Internal Medicine

## 2012-10-18 ENCOUNTER — Telehealth (HOSPITAL_BASED_OUTPATIENT_CLINIC_OR_DEPARTMENT_OTHER): Payer: Self-pay

## 2012-10-18 NOTE — Progress Notes (Signed)
Spoke with Luvenia Redden, difficulty with trinessa and Beverlee Nims is the high estrogen componant and given patient is smoking the clotting risk is significant for her.     Will attempt PA for Ortho lo and fup. Patient to continue on desogen for now.

## 2012-10-18 NOTE — Progress Notes (Signed)
Informed pharmacy that Ortho Tri Cyclen LO has been D/C'd   Called in script for APri that was not received on 10/04/2012   Script will be filled as generic equivalent Desogen   No new prescription needed at this time

## 2012-10-18 NOTE — Telephone Encounter (Signed)
Message copied by Sharlyne Pacas on Thu Oct 18, 2012  2:44 PM  ------       Message from: [place]       Created: Thu Oct 18, 2012  2:26 PM       Regarding: Pharmacy-questions regarding medication       Contact: [phone]                       Milady Fleener [id number], 27 year old, female, Telephone Information:       Home Phone      [phone]       Work Phone      Not on file.       Mobile          [phone]                     Patient's Preferred Pharmacy:               [id number] - [place], [place] - [address] AT [facility] [address]       Phone: [phone] Fax: [phone]                     CONFIRMED TODAY: Yes              CALL BACK NUMBER:[phone]              Patient's language of care: English              Patient does not need an interpreter.              Patient's PCP: Alton Revere MD              Person calling on behalf of patient: [facility]              Calls today with questions and concerns.              Pharmacy is calling wants to speak to Rn regarding Birth control.  ------

## 2012-10-18 NOTE — Progress Notes (Signed)
[facility] INTERNAL MED    Person calling on behalf of patient: Pharmacy    May list multiple medications in this section    Medicine Name: ortho-tri-cyclen lo tabs    Dosage:     Frequency (how many pills, how many times a day): take 1 tablet daily by mouth    Number of pills left: none    Documented patient preferred pharmacies:   [id number] - [place], [place] - [address] AT [facility] [address]  Phone: [phone] Fax: [phone]

## 2012-10-18 NOTE — Progress Notes (Addendum)
Call to pharmacist Gabby,desogen patient is presently ordered for has higher risk of clotting and patient is smoker-pharmacist concerned  A few generic options similar to orthotrycyclen lo are-  trinessa or mononessa and they have progesterone component-runs lower risk of clotting but good for acne which is what patient wants  To provider

## 2012-10-18 NOTE — Progress Notes (Signed)
Ortho Tri-Cyclen Lo requires prior authorization through the patients insurance. Please fill out the following information below and send back to the [employer].    Prior authorization request for Ortho Tri-Cyclen Lo  Clinical Indication:   Previous medications tried:   Diagnostic testing information:       Patient has [employer] ([employer] [phone]) for prescription coverage.  I.D.# [id number]

## 2012-10-18 NOTE — Progress Notes (Addendum)
Call to pharmacy: pt broke out with Desogen and is requesting alternative per pharmacist.  Ortho Tri Cyclen Lo requires PA.

## 2012-10-18 NOTE — Progress Notes (Signed)
Informed pharmacy that Ortho Tri Cyclen LO has been D/C'd   Called in script for APri that was not received on 10/04/2012   Script will be filled as generic equivalent Desogen   No new prescription needed at this time

## 2012-10-18 NOTE — Telephone Encounter (Signed)
Message copied by Ledell Peoples on Thu Oct 18, 2012 12:32 PM  ------       Message from: Riccardo Dubin       Created: Thu Oct 18, 2012 12:00 PM       Regarding: speak with the nurse again       Contact: [phone]         Sara Blair is a 27 year old old female.              Patient's PCP: STAHL,BONNI MD              In case we get disconnected what is the best number to reach you at today:       Other: [phone]              Person calling:       Andrey Campanile from [facility] pharmacy              How can I help you today:        York Spaniel that spoke with the nurse before, is requesting to speak with the nurse again.              Patient's language of care: English              Would you like an interpreter when the nurse calls you back?       NO         ------

## 2012-10-19 NOTE — Progress Notes (Signed)
Patient has Psychologist, educational for prescription coverage.  ID #: [id number]    Filled out Medication Prior Authorization Request Form for Ortho Tri-Cyclen Lo. Scanned form into Epic under Careers information officer (PA for Ortho Tri-Cyclen Lo).      Please print out form to be reviewed, signed, and faxed to the insurance company.  After faxing to the insurance company, please scan signed form into Media Manager to keep on record.

## 2012-10-19 NOTE — Progress Notes (Signed)
Prior authorization request for Ortho Tri-Cyclen Lo   Clinical Indication: contraception, acne  Previous medications tried: Desogen, microgestin  Diagnostic testing information: Looking for OCP that will help to control patients acne. Noted significant worsening of acne on microgestin, Patient is a smoker and is at higher risk for blood clots taking desogen given type of progesterone. Also do not want to put her on a high dose estrogen tablet do to clotting risk.

## 2012-10-25 NOTE — Progress Notes (Signed)
Called [employer] to check on the status of the Ortho Tri Cyclen Lo prior authorization request    The insurance company has not received a prior authorization request form for this medication.    Please print out the form that was scanned into Media Manager to be reviewed, signed, and faxed to the insurance company.

## 2012-10-31 MED ORDER — NORGESTIM-ETH ESTRAD TRIPHASIC 0.18/0.215/0.25 MG-25 MCG PO TABS
1.0000 | ORAL_TABLET | Freq: Every day | ORAL | Status: DC
Start: 2012-10-31 — End: 2013-11-04

## 2012-10-31 NOTE — Progress Notes (Signed)
Prior Authorization for Ortho Tri-Cylen LO has been APPROVED.  Insurance: [employer]  ID#: [id number]  PA # [id number] has been approved for lifetime of patient's plan.    **This medication is listed in Epic as D/C'd and Apri has been prescribed in place of it. If patient is to be on this medication please send new prescription to patient's preferred pharmacy**    Approval notice will be scanned into Media Manager.

## 2012-10-31 NOTE — Progress Notes (Signed)
rx sent

## 2012-11-14 ENCOUNTER — Ambulatory Visit (HOSPITAL_BASED_OUTPATIENT_CLINIC_OR_DEPARTMENT_OTHER): Payer: No Typology Code available for payment source | Admitting: Infectious Diseases

## 2012-11-22 ENCOUNTER — Ambulatory Visit (HOSPITAL_BASED_OUTPATIENT_CLINIC_OR_DEPARTMENT_OTHER): Payer: No Typology Code available for payment source | Admitting: Internal Medicine

## 2012-11-22 ENCOUNTER — Encounter (HOSPITAL_BASED_OUTPATIENT_CLINIC_OR_DEPARTMENT_OTHER): Payer: Self-pay | Admitting: Internal Medicine

## 2012-11-22 VITALS — BP 110/70 | HR 66 | Temp 98.0°F | Resp 18 | Wt 160.0 lb

## 2012-11-22 DIAGNOSIS — L708 Other acne: Secondary | ICD-10-CM

## 2012-11-22 DIAGNOSIS — M25569 Pain in unspecified knee: Secondary | ICD-10-CM

## 2012-11-22 MED ORDER — BENZOYL PEROXIDE 5 % EX LIQD
Freq: Two times a day (BID) | CUTANEOUS | Status: DC
Start: 2012-11-22 — End: 2013-12-13

## 2012-11-22 MED ORDER — NAPROXEN 500 MG PO TABS
500.0000 mg | ORAL_TABLET | Freq: Two times a day (BID) | ORAL | Status: DC
Start: 2012-11-22 — End: 2014-02-27

## 2012-11-22 NOTE — Progress Notes (Signed)
Sara Blair is a 27 year old female who presents with the following problems:  Chest and back acne. Recenetly started on ortho tri cyclin lo on med for almost 3 weeks. Acne started while she started microgestin. Has been off for the past 2-3 mo's. None prior to this.   Using benzyl peroxide, clindamycin. X 2 weeks.     Left knee pain. Anterior and lateral.   Using icy hot heating pad. No known injury. No redness, heat or swelling. Getting gradually worse over the past 2 months. Started as sharp shooting pain that then quickly goes away on its own. Pain worst after rising from sitting. Not noted going up or down stairs. No change in daily routine. Works as a Engineer, water. Going to the gym the past couple months. Bike and abd machine going into 2nd month. Now pain in the back of the knee as well lasting minutes. Anterior knee pain on right side. Much less. Not taken any meds.       ROS:  Constitutional: no fevers  MSK: as above  Neuro: no focal deficits      Past medical history/Past surgical History  Noted in Epic    Social History Narrative  Noted in Epic    Family History Narrative  Noted in Epic      Review of Patient's Allergies indicates:   Penicillins             Rash        Current Outpatient Prescriptions on File Prior to Visit:  norgestimate-ethinyl estradiol triphasic (ORTHO TRI-CYCLEN LO) 0.18/0.215/0.25 MG-25 MCG per tablet Take 1 tablet by mouth daily. Disp: 28 tablet Rfl: 11   ranitidine (ZANTAC) 150 MG capsule Take 1 capsule by mouth 2 (two) times daily. Disp: 60 capsule Rfl: 6   buPROPion (WELLBUTRIN SR) 100 MG 12 hr tablet Take 1 tablet by mouth 2 (two) times daily. Disp: 60 tablet Rfl: 3   clindamycin (CLEOCIN T) 1 % lotion Apply  topically 2 (two) times daily. Disp: 28 mL Rfl: 6     No current facility-administered medications on file prior to visit.    OBJECTIVE:  BP 110/70   Pulse 66   Temp(Src) 98 F (36.7 C) (Oral)   Resp 18   Wt 160 lb (72.576 kg)   BMI 26.63 kg/m2   SpO2 98%   LMP  10/23/2012  General: seated comfortable in NAD, alert, pleasant and cooperative  Eyes: Sclera anicteric.  HEENT: Mucous membranes moist.   Skin: Warm, dry comedones scattered over face, chest and back. occational cystic component on chin. Somewhat inflammatory on chest and back.   Cardiac: Heart regular rate rhythm. Normal S1 S2. Without murmur gallop or rub. No clubbing cyanosis or edema of extremities. DP and PT pulses +2.   Pulm: Patient speaking in full sentences. Lungs clear to auscultation.   Ext: Lower extremities without lesions. No erythema or deformity noted. Full range of motion without point tenderness over B knees. Both knees stable with pain on patellar subluxation R>L        A/P:  (719.46) Anterior knee pain  Comment: Given hx and exam suspect patellar subluxation. Fits given recent return to activity. Will give short course NSAID, PT referral, fup 2 months.   Plan: naproxen (NAPROSYN) 500 MG tablet, REFERRAL TO         PHYSICAL THERAPY ( INT)    (706.1) Other acne  Comment: refill BP. Will give more time on new OCP. Fup 2 mo to  consider oral abx therapy.   Plan: benzoyl peroxide (BENZOYL PEROXIDE) 5 %         external liquid

## 2012-11-28 DIAGNOSIS — L708 Other acne: Secondary | ICD-10-CM | POA: Insufficient documentation

## 2012-12-05 ENCOUNTER — Ambulatory Visit (HOSPITAL_BASED_OUTPATIENT_CLINIC_OR_DEPARTMENT_OTHER): Payer: No Typology Code available for payment source | Admitting: Infectious Diseases

## 2012-12-05 ENCOUNTER — Encounter (HOSPITAL_BASED_OUTPATIENT_CLINIC_OR_DEPARTMENT_OTHER): Payer: Self-pay | Admitting: Infectious Diseases

## 2012-12-05 VITALS — BP 113/64 | HR 75 | Temp 98.0°F | Wt 160.0 lb

## 2012-12-05 DIAGNOSIS — F1910 Other psychoactive substance abuse, uncomplicated: Secondary | ICD-10-CM

## 2012-12-05 DIAGNOSIS — B1920 Unspecified viral hepatitis C without hepatic coma: Secondary | ICD-10-CM

## 2012-12-05 LAB — CBC WITH PLATELET
HEMATOCRIT: 37.3 % (ref 34.1–44.9)
HEMOGLOBIN: 12.7 g/dL (ref 11.2–15.7)
MEAN CORP HGB CONC: 34 g/dL (ref 31.0–37.0)
MEAN CORPUSCULAR HGB: 31 pg (ref 26.0–34.0)
MEAN CORPUSCULAR VOL: 91 fL (ref 80.0–100.0)
MEAN PLATELET VOLUME: 10.3 fL (ref 8.7–12.5)
PLATELET COUNT: 212 10*3/uL (ref 150–400)
RBC DISTRIBUTION WIDTH STD DEV: 42.5 fL (ref 35.1–46.3)
RBC DISTRIBUTION WIDTH: 12.6 % (ref 11.5–14.3)
RED BLOOD CELL COUNT: 4.1 M/uL (ref 3.90–5.20)
WHITE BLOOD CELL COUNT: 3.5 10*3/uL — ABNORMAL LOW (ref 4.0–11.0)

## 2012-12-05 LAB — HEPATIC FUNCTION PANEL
ALANINE AMINOTRANSFERASE: 293 U/L — ABNORMAL HIGH (ref 12–45)
ALBUMIN: 4 g/dL (ref 3.4–5.0)
ALKALINE PHOSPHATASE: 59 U/L (ref 45–117)
ASPARTATE AMINOTRANSFERASE: 157 U/L — ABNORMAL HIGH (ref 8–34)
BILIRUBIN DIRECT: 0.1 mg/dl (ref 0.0–0.2)
BILIRUBIN TOTAL: 0.3 mg/dL (ref 0.2–1.0)
INDIRECT BILIRUBIN: 0.2 mg/dL (ref 0.2–0.9)
TOTAL PROTEIN: 7.2 g/dL (ref 6.4–8.2)

## 2012-12-05 LAB — ALPHA-FETOPROTEIN TUMOR MARKER: ALPHA-FETOPROTEIN TUMOR MARKER: 4.8 ng/mL (ref 0.0–8.0)

## 2012-12-05 LAB — CREATININE: CREATININE: 0.5 mg/dL (ref 0.4–1.2)

## 2012-12-05 LAB — BUN (UREA NITROGEN): BUN (UREA NITROGEN): 12 mg/dL (ref 7–18)

## 2012-12-05 LAB — PROTHROMBIN TIME
INR: <1 — ABNORMAL LOW (ref 2.0–3.5)
PROTHROMBIN TIME: 10 SECONDS (ref 9.5–11.5)

## 2012-12-05 NOTE — Progress Notes (Signed)
[facility]  ID:  Sara Blair is a 27 year old woman referred for evaluation and treatment of HCV infection  PCP: STAHL,BONNI MD    HISTORY OF PRESENT ILLNESS  Date of diagnosis: 2007  Risk factor:  IVDU  Estimated date of acquisition: First needle use 2006  HCV Genotype: 3 in 6/13 (1a, per patient hx:  "I swear that is what they said")  HCV Fibrosis Stage: no record in EPIC  HCV Treatment History: no  HCV viral load: 32798 in 6/13  HIV status: negative 6/13  HAV status: needs to be immunized  HBV status: immune 6/13  HCV Knowledge:"It's permanent, it's blood-borne, and if I don't keep myself healthy it can become very bad."  HCV Treatment Preferences: Intrested in treatment  Alcohol use: None  Relevant laboratory test results: HFT normal 6/13    MEDICATIONS  Wellbutrin  OCP  MVI  PAST MEDICAL HISTORY  GERD  Tobacco Use - "I just quit, it was easier than I thought"  Acne  Appendectomy    MENTAL HEALTH HISTORY  Polysubstance Use, drugs of choice were heroin and cocaine, stopped in February with assistance of suboxone which she purchased on street for several months prior to stopping. Not in a formal drug recovery program.  "I know I need to go to meetings, it is just a question of getting there.....   I hang with all sober people."    Over the past two weeks, how often have you been bothered by   -little interest or pleasure in doing things? none  -feeling down, depressed or hopeless? none      REPRODUCTIVE STATUS  Contraception: OCP  Sexually active with BF who is HCV negative.  They practice anal and vaginal intercourse    Social History  Employment: Advertising copywriter  Supports: Lives in [place] with BF who does not use drugs.  Good relationship with mother    ROS:   Fatigue: no  Weight: Trending up since stopping drugs.  Abdominal Pain: no   Nausea/Vomiting: no   Hematemesis: no   Melena: no   Pruritis:no   Jaundice:no   Dark urine: no   Easy bruising/bleeding: no   Chest Pain: no   Dyspnea:  no   LE edema: no   Skin lesions/rash: no      PHYSICAL EXAM  BP 113/64   Pulse 75   Temp(Src) 98 F (36.7 C) (Temporal)   Wt 160 lb (72.576 kg)   BMI 26.63 kg/m2   SpO2 98%   LMP 12/01/2012   HEENT: No scleral icterus  Lungs: CTA  Cor: RRR no MRG  Abdomen: No RUQ tenderness, hepatomegaly, splenomegaly, shifting dullness, caput medusa  Extremities: No edema  Skin: No spider angiomata, palmar erythema, purpura (EM[facility]); bruising, vesicles, bulla (porphyria cutaneatarda)  violaceous  papules (lichen planus)        ASSESSMENT and COUNSELING  Sara Blair is a 27 year old female with chronic HCV infection. She has likely been infected for less than ten years.  Her history suggests she was initially infected with genotype 1a which she spontaneously cleared.  Her only genotype test in the [facility] system revealed genotype 3 in June, 2013.  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  Patient was counseled that achievement of sustained virologic response with medication is associated with decrease rates of liver-related death,  liver related morbidity, need for liver transplantation, and incidence of hepatocellular carcinoma.      I emphasized that  -Alcohol promotes the progression of hepatitis C disease and complete abstinence from alcohol is recommended  -People with HCV should avoid blood donation and the sharing of  toothbrushes, dental equipment, shaving equipment,  needles, syringes.  Bleeding wounds should be covered  -HCV may be sexually transmitted.  The risk of anal transmission is greater than with vaginal intercourse. Advised condom use and water based lubricant, especially with anal intercourse   -Acetaminophen use should not exceed 2 gms per 24 hours    Treatment options for genotype 3 and genotype 1 infection were discussed in detail and I suggested we confirm her genotype  with repeat testing today.   I underscored the importance of stable sobriety prior to treatment in order to promote medication adherence      Recommendations  -HCV genotype  -Liver enzymes (ALT, AST) to assess baseline degree of hepatic inflammation  -INR, albumin, bilirubin to assess hepatic function  -Platelet count  -HCV RNA assay using test with  lower limit of quantification of 25 IU/ml and lower limit of detection of 10-15 IU/ml (COBAS TaqMan 2.0 assay, [facility] test number [id number])  -Hepatic ultrasound to assess liver contour/echogenicity, flow in the portal vein and to screen for hepatocellular carcinoma  -Consider Fibroscan if above test results/FIB-4 index raise question of advanced fibrosis    RTC 4 weeks to review laboratory test results and for assessment of sobriety    I have spent 60 minutes in face to face time with this patient/patient proxy of which > 50% was in counseling or coordination of care regarding above issues/Dx.

## 2012-12-10 ENCOUNTER — Encounter (HOSPITAL_BASED_OUTPATIENT_CLINIC_OR_DEPARTMENT_OTHER): Payer: Self-pay | Admitting: Infectious Diseases

## 2012-12-10 LAB — HEPATITIS C VIRUS GENOTYPING: HEPATITIS C VIRUS GENOTYPE: 3

## 2012-12-10 LAB — HEPATITIS C RNA PCR
HEPATITIS C RNA PCR IU: 5255320 IU/mL
HEPATITIS C RNA PCR LOG 10: 6.721

## 2012-12-18 ENCOUNTER — Encounter (HOSPITAL_BASED_OUTPATIENT_CLINIC_OR_DEPARTMENT_OTHER): Payer: Self-pay | Admitting: Infectious Diseases

## 2012-12-21 ENCOUNTER — Ambulatory Visit (HOSPITAL_BASED_OUTPATIENT_CLINIC_OR_DEPARTMENT_OTHER): Payer: Self-pay | Admitting: Infectious Diseases

## 2012-12-21 LAB — US ABDOMEN COMPLETE

## 2012-12-21 LAB — US ABDOMEN PELVIS SCROTUM & OR RETROPERITONEUM DOPPLER LIMITED

## 2012-12-23 ENCOUNTER — Emergency Department (HOSPITAL_BASED_OUTPATIENT_CLINIC_OR_DEPARTMENT_OTHER)
Admission: RE | Admit: 2012-12-23 | Disposition: A | Discharge status: Home / Self Care | Payer: Self-pay | Source: Emergency Department | Attending: Emergency Medicine | Admitting: Emergency Medicine

## 2012-12-23 LAB — RAPID STREP (POINT OF CARE): STREP SCREEN: NEGATIVE

## 2012-12-23 NOTE — ED Triage Note (Signed)
Sore throat for 3 days. No fever.

## 2012-12-23 NOTE — ED Provider Notes (Signed)
I have reviewed the ED nursing notes and prior records. I have reviewed the patient's past medical history/problem list, allergies, social history and medication list.  I saw this patient primarily.    HPI:  This 27 year old female patient presents with chief complaint of evaluation of sore throat for the past 2 days. Patient states that she has had some difficulty swallowing.  She states that she has had cough productive of yellow white sputum.  She denies any difficulty breathing.  Patient has not had any fevers or chills.  Patient has not had any sick contacts or recent travel.  Patient denies any abdominal pain, nausea, vomiting, diarrhea.    Pertinent positives were reviewed as per the HPI above. All other systems were reviewed and are negative.    Past Medical History/Problem List:  No past medical history on file.  Patient Active Problem List:     Unspecified viral hepatitis C without hepatic coma     Opiate dependence     Polysubstance abuse     Tobacco use disorder     Multiple lung nodules     Esophageal reflux     Other acne      Past Surgical History:  No past surgical history on file.    Medications:     No current facility-administered medications on file prior to encounter.  Current Outpatient Prescriptions on File Prior to Encounter:  norgestimate-ethinyl estradiol triphasic (ORTHO TRI-CYCLEN LO) 0.18/0.215/0.25 MG-25 MCG per tablet Take 1 tablet by mouth daily. Disp: 28 tablet Rfl: 11   buPROPion (WELLBUTRIN SR) 100 MG 12 hr tablet Take 1 tablet by mouth 2 (two) times daily. Disp: 60 tablet Rfl: 3   benzoyl peroxide (BENZOYL PEROXIDE) 5 % external liquid Apply  topically 2 (two) times daily. Disp: 237 g Rfl: 5   naproxen (NAPROSYN) 500 MG tablet Take 1 tablet by mouth 2 (two) times daily with meals. For 5 days. Disp: 60 tablet Rfl: 0   ranitidine (ZANTAC) 150 MG capsule Take 1 capsule by mouth 2 (two) times daily. Disp: 60 capsule Rfl: 6   clindamycin (CLEOCIN T) 1 % lotion Apply  topically 2 (two)  times daily. Disp: 28 mL Rfl: 6       Social History:     Smoking status: Former Smoker  0.25 Packs/Day  For 9.00 Years     Types: Cigarettes    Smokeless tobacco: Not on file    Alcohol Use: No    Comment: currently clean, Etoh up to couple bottle, pint a day.        Allergies:  Review of Patient's Allergies indicates:   Penicillins             Rash    Physical Exam:  ED Triage Vitals   Enc Vitals Group      BP 12/23/12 1049 126/77 mmHg      Pulse 12/23/12 1049 90      Resp 12/23/12 1049 20      Temp 12/23/12 1049 98.4 F      Temp src --       SpO2 12/23/12 1049 100 %      Weight 12/23/12 1049 68.04 kg      Height --       Head Cir --       Peak Flow --       Pain Score 12/23/12 1049 10       Pain Loc --       Pain Edu? --  Excl. in GC? --        GENERAL:  Well-appearing, no distress.  SKIN:  Warm & Dry, no rash, no bruising.  HEAD:  Atraumatic. PERRL. Anicteric sclera.   ENT:  TM are pearly grey without any erythema or bulging, Oropharynx with mild erythema, no exudates noted  NECK:  Supple, cervical adenopathy present, nontender  LUNGS:  Clear to auscultation bilaterally without rales, rhonchi or wheezing.   HEART:  RRR.  No murmurs, rubs, or gallops.   ABDOMEN:  Soft, flat, without distension.  Nontender to palpation.   MUSCULOSKELETAL:  No deformities. Well-perfused extremities.   NEUROLOGIC:  Normal speech.  alert & oriented x 3, moves all extremities symmetrically, normal gait    ED Course and Medical Decision-making:    Pertinent labs and imaging studies reviewed.  Emergency Department nursing record was.  Prior records as available electronically through the [facility] record were reviewed.      Results for orders placed during the hospital encounter of 12/23/12 (from the past 24 hour(s))   RAPID STREP (POINT OF CARE)    Collection Time     12/23/12 10:53 AM       Result Value    STREP SCREEN negative      ONBOARD CONTROL PRESENT? yes     THROAT CULTURE, BETA STREP    Collection Time     12/23/12 10:55 AM     Narrative:     Source Details->throat         In brief this is a 27 year old female here for evaluation of sore throat for the last several days.  Patient's oropharynx is erythematous without any exudate.  She has not noted to have any neck stiffness, is able to flex and extend the neck without difficulty.  I have low suspicion for retropharyngeal abscess.  She does not have signs of peritonsillar abscess on exam.  Patient also has complaints of a cough, is not noted to be febrile, hypoxic, have any focal lung findings on exam to suggest a pneumonia at this time.  Rapid strep was negative.  Patient likely with viral illness.  Patient was instructed to continue taking NSAIDs as needed for throat pain.  She will drink plenty of fluids over the next several days and follow up with her primary care physician if not improved in the next week.    The reasons to return to the emergency department were reviewed in detail with the patient.   Patient appeared to understand and be in agreement with this treatment plan and disposition    Disposition: Discharge home  Condition on ED departure:  Improved and stable  PCP:  STAHL,BONNI MD    Diagnosis/Diagnoses:  Pharyngitis    Porfirio Mylar, MD  Emergency Department Attending Physician  [facility]

## 2012-12-23 NOTE — Discharge Instructions (Signed)
You were seen in the emergency department today for a sore throat.  A rapid strep test was performed and was negative.  It is likely that you have a viral syndrome.  You may continue to take medications like naproxen or ibuprofen as needed for throat pain.  Try to drink plenty of fluids over the next several days.  Please make a followup appointment to see your primary care physician in the next 3-5 days    Return immediately to the emergency department for any development of worsening pain, any difficulty breathing, difficulty swallowing, or if you have any further concerns.         Index Spanish version Illustration Related Topics     Viral Sore Throat   What is viral sore throat?   A viral sore throat is an infection of the throat caused by a virus.  How does it occur?   A viral sore throat occurs when a virus attacks the throat area. Many different viruses can cause a sore throat, including:   flu viruses    common cold viruses    coxsackievirus, which causes a very painful throat infection called herpangina    infectious mononucleosis ("mono") virus.   What are the symptoms?   The symptoms will vary slightly depending on which type of virus you have.  The symptoms of flu virus infections can include:   sore throat    cough    fever    muscle aches.   Some of the symptoms of infection with a cold virus are:   sore throat    runny nose    cough    white bumps on the tonsils    mild soreness and swelling of the lymph nodes in your neck.   The symptoms of herpangina can include:   sore throat    fever    headache    poor appetite    pain in the stomach, neck, arms, and legs    sores on the throat, tongue, or roof of the mouth that heal quickly.   The symptoms of infectious mononucleosis can include:   fever    extreme, prolonged fatigue lasting 1 or more weeks    swollen tonsils    white coating on the tonsils and throat    red spots on the roof of the mouth    large swollen lymph nodes  ("glands") in the neck    faint red rash on the chest or whole body.   How is it diagnosed?   It is often hard to tell whether a sore throat is caused by a virus or by strep bacteria. In general, the main symptom of strep throat is a severe sore throat with trouble swallowing. Other possible symptoms of strep are fever, swollen lymph nodes in the neck, white spots on the tonsils, and sometimes headache. On the other hand, sneezing, a runny nose, and nasal congestion are common symptoms of infections by a virus, including those that cause sore throats.  To diagnose a viral sore throat, your healthcare provider will review your symptoms and examine you. He or she may also take a throat swab to check for strep throat. Many offices and clinics now have very accurate rapid throat swab tests that allow diagnosis of strep within a few minutes or a few hours.  If your provider suspects mononucleosis, a blood test may also be done.  How is it treated?   The treatment of a viral sore throat is similar  to that of the common cold. Your healthcare provider will usually not prescribe antibiotics because antibiotics do not kill viruses. To relieve pain:   Take nonprescription pain medicine.    Gargle with warm water. Some people feel more relief if some salt is added to the water.   Other possible treatment depends on the type of virus causing the infection.  How long will the effects last?   The effects will last as long as the virus affects the body. Most viral infections last from several days to 2 weeks. Mononucleosis may last longer.  Virus infections can be more serious for older adults.  How can I take care of myself?   To help take care of yourself, take the full course of treatment your healthcare provider prescribes. Get lots of rest until the fever is gone.  For a sore throat:   Drink chicken soup, cold drinks, and other clear, nutritious liquids. If it is painful to eat, don't eat solid food. When you can eat, eat  healthy foods.    Do not smoke cigarettes or breathe secondhand smoke.    Gargle and spit with warm saltwater (1/4 teaspoon salt per 8 ounces, or 240 mL, of warm water) as often as is comfortable.    Suck on hard lozenges or candy.    Take nonprescription pain relief medicine according to the directions on the package.    Limit activities, especially those requiring talking.   If you have a fever:   Ask your healthcare provider if you can take aspirin or acetaminophen to control your fever. Do not give any medicine that contains aspirin or salicylates to a child or teen. This includes medicines like baby aspirin, some cold medicines, and Pepto-Bismol. Children and teens who take aspirin are at risk for a serious illness called Reye's syndrome. Nonsteroidal anti-inflammatory medicines (NSAIDs), such as ibuprofen and aspirin, may cause stomach bleeding and other problems. These risks increase with age. Read the label and take as directed. Unless recommended by your healthcare provider, do not take for more than 10 days for any reason.   If you have diarrhea:   Drink clear liquids such as water, juice, tea, and bouillon often during the day.    Reduce your normal activities until the diarrhea has stopped.    If you are nauseated, suck on ice chips.    When you feel better, eat cooked cereal, rice, applesauce, baked potato, and toast. You may also have carbonated drinks.    Return to normal eating 2 or 3 days later. Avoid alcohol, milk products, and highly seasoned and spicy foods for several more days.   Call your healthcare provider if:   You have a severe sore throat for more than 48 hours.    You have a fever, chills, or sweats.    You have painful swollen neck glands (lymph nodes).    You are unable to swallow or are not eating or drinking well.    You have pus (white spots) on your tonsils.   How can I help prevent spread of viral sore throat?   If you have been diagnosed with a viral sore  throat:   Avoid contact with others until your symptoms are gone. However, many viruses are most contagious before symptoms start.    Wash your hands after coughing, sneezing, or blowing your nose.    Use tissues when coughing or sneezing and throw them in the trash right away.    Wash your hands  before touching food or food-related items such as dishes, glasses, silverware, or napkins.    Don't share food or eating utensils with others.    Use paper cups and paper towels in bathrooms instead of common drinking cups or shared hand towels.   Developed by [employer].  Adult Advisor 2012.1 published by [employer].  Last modified: 2009-06-30  Last reviewed: 2009-03-24  This content is reviewed periodically and is subject to change as new health information becomes available. The information is intended to inform and educate and is not a replacement for medical evaluation, advice, diagnosis or treatment by a healthcare professional.  References   Adult Advisor 2012.1 Index    2012 [employer] and/or its affiliates. All rights reserved.

## 2012-12-23 NOTE — ED Notes (Signed)
Patient Disposition    Patient education for diagnosis, medications, activity, diet and follow-up.  Patient left ED 11:16 AM.  Patient rep received written instructions.  Interpreter to provide instructions: No    Discharged to: Discharged to home instructions reviewed.

## 2012-12-25 LAB — THROAT CULTURE BETA STREP

## 2013-01-02 ENCOUNTER — Telehealth (HOSPITAL_BASED_OUTPATIENT_CLINIC_OR_DEPARTMENT_OTHER): Payer: Self-pay | Admitting: Infectious Diseases

## 2013-01-02 ENCOUNTER — Telehealth (HOSPITAL_BASED_OUTPATIENT_CLINIC_OR_DEPARTMENT_OTHER): Payer: Self-pay | Admitting: Registered Nurse

## 2013-01-02 ENCOUNTER — Ambulatory Visit (HOSPITAL_BASED_OUTPATIENT_CLINIC_OR_DEPARTMENT_OTHER): Payer: No Typology Code available for payment source | Admitting: Infectious Diseases

## 2013-01-02 ENCOUNTER — Encounter (HOSPITAL_BASED_OUTPATIENT_CLINIC_OR_DEPARTMENT_OTHER): Payer: Self-pay | Admitting: Infectious Diseases

## 2013-01-02 VITALS — BP 112/70 | HR 71 | Temp 98.8°F | Resp 20 | Wt 160.0 lb

## 2013-01-02 DIAGNOSIS — B1920 Unspecified viral hepatitis C without hepatic coma: Secondary | ICD-10-CM

## 2013-01-02 DIAGNOSIS — F1910 Other psychoactive substance abuse, uncomplicated: Secondary | ICD-10-CM

## 2013-01-02 DIAGNOSIS — Z23 Encounter for immunization: Secondary | ICD-10-CM

## 2013-01-02 DIAGNOSIS — L708 Other acne: Secondary | ICD-10-CM

## 2013-01-02 MED ORDER — SOFOSBUVIR 400 MG PO TABS
400.0000 mg | ORAL_TABLET | Freq: Every day | ORAL | Status: DC
Start: 2013-01-02 — End: 2013-01-22

## 2013-01-02 MED ORDER — RIBAVIRIN 200 MG PO TABS
1200.0000 mg/d | ORAL_TABLET | Freq: Two times a day (BID) | ORAL | Status: DC
Start: 2013-01-02 — End: 2013-01-22

## 2013-01-02 MED ORDER — CLINDAMYCIN-TRETINOIN 1.2-0.025 % EX GEL
Freq: Every evening | CUTANEOUS | Status: DC
Start: 2013-01-02 — End: 2013-04-22

## 2013-01-02 NOTE — Progress Notes (Signed)
PA forms downloaded from Careers information officer for provider to review, complete, and sign.  Once complete forms to be faxed as directed.     Provider signed form  Forms faxed  Copies scanned into pt chart

## 2013-01-02 NOTE — Telephone Encounter (Signed)
Message copied by Darel Hong on Wed Jan 02, 2013  2:40 PM  ------       Message from: [place], [place]       Created: Wed Jan 02, 2013  2:34 PM       Regarding: Prior Auth         Prior authorization request for combination clindamycin-trentinoin topical gel       Clinical Indication: acne       Previous medications tried: OCPs, benzoyl peroxide, topical clindamycin       Exam reveals diffuse facial acne         ------

## 2013-01-02 NOTE — Progress Notes (Signed)
Hepatitis C Prior Authorization Details    Medication(s) Requested:  Ribavirin and solvaldi    Clinical Indications:  Hepatitis C infection    Diagnostic Testing Information:    No results found for this basename: hcvrnaiu  No results found for this basename: hepcbdna  HEPATITIS C VIRUS GENOTYPE (no units)   Date  Value    12/05/2012  3    ----------  No results found for this basename: hcvfibroscor  No results found for this basename: hcvfibrostag  ALANINE AMINOTRANSFERASE (ALT) (U/L)   Date  Value    12/05/2012  293*   ----------          Proposed duration of therapy:24 weeks.  Weeks of therapy patient has received as part of this course: 0 week(s)

## 2013-01-02 NOTE — Progress Notes (Signed)
Prior authorization request for combination clindamycin-trentinoin topical gel    Clinical Indication: acne    Previous medications tried: OCPs, benzoyl peroxide, topical clindamycin    Exam reveals diffuse facial acne

## 2013-01-02 NOTE — Progress Notes (Signed)
Prior authorization required for medication: Ribavirin and Sovaldi     Please fill out the following information so a prior authorization can be processed for this patient. Please send to the [facility].    Prior authorization request for   Clinical Indication:   Previous medications tried:   Diagnostic testing information:

## 2013-01-02 NOTE — Progress Notes (Signed)
01/02/2013  VIS given prior to administration and reviewed with the patient and or legal guardian. Patient understands the disease and the vaccine. See immunization/Injection module or chart review for date of publication and additional information. Patient tolerated IM injection of Hepatitis B #1 vaccine well.     Patient in agreement with the MIIS program.     Francisca December, RN

## 2013-01-02 NOTE — Progress Notes (Signed)
Patient has [employer] for prescription coverage.  I.D.#: [id number]    Filled out [employer] Prior Authorization Request form. Scanned form into Epic under Careers information officer (PA for Ziana gel).    Please print out form to be reviewed, signed, and faxed to the insurance company.  (please scan signed copy of prior authorization form back into Careers information officer)

## 2013-01-02 NOTE — Progress Notes (Signed)
[facility]  ID:  Sara Blair is a 27 year old woman who returns for evaluation and treatment of HCV infection  PCP: STAHL,BONNI MD    HISTORY OF PRESENT ILLNESS  Date of diagnosis: 2007  Risk factor:  IVDU  Estimated date of acquisition: First needle use 2006  HCV Genotype: 3 (history of 1a)  HCV Fibrosis Stage: FIB4 < F2 using 5/14 laboratory values  Imaging: [place] 5/14 without focal hepatic lesion but with altered waveform in portal vein -> 6 month f/u recommended  HCV Treatment History: no  HCV viral load: 5.2 million in 5/14   HIV status: negative 6/13  HAV status: needs to be immunized  HBV status: immune 6/13  HCV Knowledge:"It's permanent, it's blood-borne, and if I don't keep myself healthy it can become very bad."  HCV Treatment Preferences: Intrested in treatment  Alcohol use: None  Relevant laboratory test results:        MEDICATIONS  Wellbutrin  OCP  MVI  Topical benzoyl peroxide 5% bid  Topical clindamycin bid    PAST MEDICAL HISTORY  GERD  Tobacco Use - "I just quit, it was easier than I thought"  Acne  Appendectomy    MENTAL HEALTH HISTORY  Polysubstance Use, drugs of choice were heroin and cocaine, stopped in February, 2014 with assistance of suboxone which she purchased on street for several months prior to stopping. Not in a formal drug recovery program and denies any cravings      REPRODUCTIVE STATUS  Contraception: OCP  Sexually active with BF who is HCV negative.  They practice anal and vaginal intercourse    ROS  Concerned about acne.    Has been told by PCP that she is on optimal OCP for this issue -- she has not tried yaz or yasmin in past  Uses topical benzoyl peroxide and clindamyin BID without satisfactory result    Social History  Employment: Advertising copywriter  Supports: Moved to [place] with BF  Good relationship with mother        PHYSICAL EXAM  BP 112/70   Pulse 71   Temp(Src) 98.8 F (37.1 C) (Temporal)   Resp 20   Wt 160 lb 0.5 oz (72.59 kg)   BMI 26.63  kg/m2   SpO2 96%   LMP 12/23/2012   Diffuse inflammatory acne face, chest, back      Laboratory Test Results  Component      Latest Ref Rng 12/05/2012   WHITE BLOOD CELL      4.0 - 11.0 TH/uL 3.5 (L)   RED BLOOD CELL COUNT      3.90 - 5.20 M/uL 4.10   HEMOGLOBIN      11.2 - 15.7 g/dL 03.4   HEMATOCRIT      34.1 - 44.9 % 37.3   MEAN CORPUSCULAR VOL      80.0 - 100.0 fL 91.0   MEAN CORPUSCULAR HGB      26.0 - 34.0 pg 31.0   MEAN CORP HGB CONC      31.0 - 37.0 g/dL 74.2   RBC DISTRIBUTION WIDTH STD DEV      35.1 - 46.3 fL 42.5   RBC DISTRIBUTION WIDTH      11.5 - 14.3 % 12.6   PLATELET COUNT      150 - 400 TH/uL 212   MEAN PLATELET VOLUME      8.7 - 12.5 fL 10.3   TOTAL PROTEIN      6.4 - 8.2 g/dL 7.2  ALBUMIN      3.4 - 5.0 g/dL 4.0   BILIRUBIN TOTAL      0.2 - 1.0 mg/dL 0.3   BILIRUBIN DIRECT      0.0 - 0.2 mg/dl 0.1   BILIRUBIN INDIRECT      0.2 - 0.9 mg/dL 0.2   ALKALINE PHOSPHATASE      45 - 117 U/L 59   ASPARTATE AMINOTRANSFERASE (AST)      8 - 34 U/L 157 (H)   ALANINE AMINOTRANSFERASE (ALT)      12 - 45 U/L 293 (H)   PROTHROMBIN TIME      9.5 - 11.5 SECONDS 10.0   INR      2.0 - 3.5 < 1.0 (L)   HEPATITIS C RNA PCR IU      . IU/mL [id number]   HEPATITIS C RNA PCR LOG 10      .   6.721   BLOOD UREA NITROGEN      7 - 18 mg/dL 12   CREATININE      0.4 - 1.2 mg/dL 0.5   ALPHA FETOPROTEIN      0.0 - 8.0 ng/mL 4.8   HEPATITIS C VIRUS GENOTYPE      See Note 3         ASSESSMENT and COUNSELING  Sara Blair is a 27 year old female with chronic HCV infection. She has likely been infected for less than ten years.  Her history suggests she was initially infected with genotype 1a which she spontaneously cleared and then was re-infected with genotype 3. FIB-4 score does not suggest advanced fibrosis.     The natural history of untreated HCV and benefits of HCV treatment were reviewed.  Sara Blair was counseled that achievement of sustained virologic response with medication is associated with decreased rates of liver-related death,  liver related morbidity, need for liver transplantation, and incidence of hepatocellular carcinoma. We reviewed recent data which suggests a 90% response rate to treatment with sofosbuvir and ribavirin for 24 weeks.      We discussed pros and cons of immediate versus deferred therapy.  I underscored the importance of stable sobriety prior to initiating treatment in order to promote medication adherence.  Patient feels secure in her sobriety and prefers to start HCV treatment as soon as possible.    Presentation today o/w remarkable for inflammatory acne on face, back, and chest.  Sara Blair has been using benzoyl peroxide 5% topical solution BID and topical clindamycin BID      Recommendations  Seek PA for 24 week course of sofosbuvir and ribavirin  Plan to return to clinic June 25, hopefully with medications in hand, for medication teaching and pregnancy test prior to starting therapy  Stressed importance of not becoming pregnant before during or for 6 months after treatment with ribavirin  Suggested she use [facility] for HCV medications  HAV vaccine today and again in 6 months  Encouraged AA or NA meetings to support sobriety  Combination clindamycin-trentinoin gel for acne - PA submitted to centralized refill pool    I have spent 30 minutes in face to face time with this patient/patient proxy of which > 50% was in counseling or coordination of care regarding above issues/Dx.

## 2013-01-02 NOTE — Progress Notes (Deleted)
Prior authorization request for                                 Clinical Indication:   Prior authorization request for combination clindamycin-trentinoin topical gel    Clinical Indication: acne    Previous medications tried: OCPs, benzoyl peroxide, topical clindamycin    Exam reveals diffuse facial acne                Previous medications tried:                                 Diagnostic testing information:

## 2013-01-03 ENCOUNTER — Ambulatory Visit (HOSPITAL_BASED_OUTPATIENT_CLINIC_OR_DEPARTMENT_OTHER): Payer: No Typology Code available for payment source | Admitting: Internal Medicine

## 2013-01-03 NOTE — Progress Notes (Signed)
PA forms downloaded from Careers information officer for provider to review, complete, and sign.  Once complete forms to be faxed as directed.      Paperwork signed and sent to network health

## 2013-01-03 NOTE — Progress Notes (Signed)
Patient has Psychologist, educational for prescription coverage.  ID #: [id number]      Filled out Hepatitis C Prior Authorization Request Form for Sovaldi 400mg . Scanned form into Epic under Careers information officer (PA for [employer]).    Filled out Hepatitis C Prior Authorization Request Form for Ribavirin 200mg  tablets. Scanned form into Epic under Careers information officer (PA for Ribavirin).      *Note, medical records/ office notes have been scanned in with each Prior Authorization in [employer].  Please print out form to be reviewed, signed, and faxed to the insurance company.  After faxing to the insurance company, please scan signed form into Media Manager to keep on record.

## 2013-01-04 ENCOUNTER — Telehealth (HOSPITAL_BASED_OUTPATIENT_CLINIC_OR_DEPARTMENT_OTHER): Payer: Self-pay | Admitting: Infectious Diseases

## 2013-01-04 NOTE — Progress Notes (Signed)
Received a fax from [employer] for more information to complete the prior authorization for Sovaldi and Ribavirin,     The forms were completed and scanned into Careers information officer.     Please print them out to be reviewed, signed, and faxed to the insurance company.

## 2013-01-04 NOTE — Progress Notes (Signed)
[employer] is requesting additional information regarding the prior authorization for Ziana.     Please print out the form, answer the questions, signed, and faxed to the insurance company.

## 2013-01-10 ENCOUNTER — Telehealth (HOSPITAL_BASED_OUTPATIENT_CLINIC_OR_DEPARTMENT_OTHER): Payer: Self-pay | Admitting: Registered Nurse

## 2013-01-10 NOTE — Progress Notes (Signed)
Patient calling to inquire about status of PA for HCV medications.  Insurance company is requiring additional information and Dr Damita Dunnings is not available

## 2013-01-10 NOTE — Progress Notes (Signed)
Forms completed, signed, faxed, sent for scanning.

## 2013-01-10 NOTE — Telephone Encounter (Signed)
Message copied by Delcie Roch on Thu Jan 10, 2013 12:21 PM  ------       Message from: [place], [place]       Created: Thu Jan 10, 2013 12:03 PM                       Richardine Peppers [id number], 27 year old, female, Telephone Information:       Home Phone      [phone]       Work Phone      Not on file.       Mobile          [phone]                     Patient's Preferred Pharmacy:               [id number] - [place], [place] - [address] AT [facility] [address]        Phone: [phone] Fax: [phone]              [facility] [facility] ([facility])       Phone: [phone] Fax: [phone]                     CONFIRMED TODAY: Lovett Sox NUMBER: [phone]       Best time to call back:        Cell phone:        Other phone:              Available times:              Patient's language of care: English              Patient does not need an interpreter.              Patient's PCP: STAHL,BONNI MD              Person calling on behalf of patient: Patient (self)              Calls today with questions and concerns: PRIOR AUTH              Ribavirin (Tab) COPEGUS 200 MG Take 3 tablets by mouth 2 (two) times daily.                   Sofosbuvir (Tab) SOVALDI 400 MG Take 1 tablet by mouth daily.               Benzoyl Peroxide (Liquid) benzoyl peroxide 5 % Apply  topically 2 (two) times daily.                                             ------

## 2013-01-11 NOTE — Progress Notes (Signed)
Patient has [employer] for prescription coverage.  I.D.# [id number]    PRIOR AUTHORIZATION FOR Sovaldi 400 mg & Ribavirin 200 mg    The prior authorization has been denied. The denial notice has been scanned into Careers information officer.    Denial Reason(s):   Sovaldi - Members with genotype 3 are required to have documented evidence of chronic liver disease (at any stage of fibrosis per biopsy, radiology, or physical findings), or in the absence of chonic liver disease, serologic evidence of persistent infection for at least 6 months.  Ribavirin - Request was denied since the use of Ribavirin was to be used in combination with Sovaldi. Since Sovaldi was denied, Ribavirin was denied.

## 2013-01-14 ENCOUNTER — Telehealth (HOSPITAL_BASED_OUTPATIENT_CLINIC_OR_DEPARTMENT_OTHER): Payer: Self-pay | Admitting: Registered Nurse

## 2013-01-14 ENCOUNTER — Telehealth (HOSPITAL_BASED_OUTPATIENT_CLINIC_OR_DEPARTMENT_OTHER): Payer: Self-pay | Admitting: Infectious Diseases

## 2013-01-14 NOTE — Progress Notes (Addendum)
Called [employer] Clinical Reviewer to appeal denial of sofosbuvir   Spoke with Meghan and informed her that patient has been infected since at least 2007 and that we have documented detectable HCV RNA since 6/13  She recommended that original request for sofosbuvir and ribavirin along with evidence of serologic evidence for more than one year be FAX'd to [phone].  This was done as per her request

## 2013-01-14 NOTE — Progress Notes (Addendum)
PA requests denied for HCV treatment.  Dr Damita Dunnings will resubmit.  Cancelled appt Wednesday. Will call patient with update when available.  see previous PA encounter for progress tracking PA

## 2013-01-14 NOTE — Progress Notes (Signed)
Signed release of information rec'd from [employer] student Cristal Ford requesting labs  WHITE BLOOD CELL (TH/uL)   Date  Value    12/05/2012  3.5*   ----------  RED BLOOD CELL COUNT (M/uL)   Date  Value    12/05/2012  4.10    ----------  HEMOGLOBIN (g/dL)   Date  Value    1/61/0960  12.7    ----------  No results found for this basename: IDEALHCT  HEMATOCRIT (%)   Date  Value    12/05/2012  37.3    ----------  No results found for this basename: HCTDIFF  MEAN CORPUSCULAR VOL (fL)   Date  Value    12/05/2012  91.0    ----------  MEAN CORPUSCULAR HGB (pg)   Date  Value    12/05/2012  31.0    ----------  MEAN CORP HGB CONC (g/dL)   Date  Value    4/54/0981  34.0    ----------  RBC DISTRIBUTION WIDTH (%)   Date  Value    12/05/2012  12.6    ----------  PLATELET COUNT (TH/uL)   Date  Value    12/05/2012  212    ----------        No results found for this basename: hivbdna  No results found for this basename: cd4count

## 2013-01-15 ENCOUNTER — Telehealth (HOSPITAL_BASED_OUTPATIENT_CLINIC_OR_DEPARTMENT_OTHER): Payer: Self-pay | Admitting: Infectious Diseases

## 2013-01-15 DIAGNOSIS — B1920 Unspecified viral hepatitis C without hepatic coma: Secondary | ICD-10-CM

## 2013-01-15 NOTE — Progress Notes (Addendum)
Prior Authorization for Sara Blair has been DENIED.  Patient has [employer] for prescription coverage, I.D.# [id number]    The prior authorization has been denied for the following reasons and has been scanned into Careers information officer.    Denial Reason(s):  - [employer] needs to know if patient has had trials with the individual ingredients, Clindamycin and Tretinoin

## 2013-01-15 NOTE — Progress Notes (Addendum)
Received request for additional information on Sovaldi PA & Ribavirin PA    Please print out form to be reviewed, signed and faxed to the insurance company.  After signing PA form please scan into Media Manager with prescriber's signature.    *Please answer Question #9 and following questions as necessary on BOTH FORMS*

## 2013-01-16 ENCOUNTER — Ambulatory Visit (HOSPITAL_BASED_OUTPATIENT_CLINIC_OR_DEPARTMENT_OTHER): Payer: No Typology Code available for payment source | Admitting: Infectious Diseases

## 2013-01-16 NOTE — Progress Notes (Signed)
FAXED SIGNED COMPLETED FORM RECEIVED TODAY,  Sent for scanning.

## 2013-01-16 NOTE — Progress Notes (Signed)
Patient has [employer] for prescription coverage.  I.D.# [id number]    PRIOR AUTHORIZATION FOR Sovaldi 400 mg & Ribavirin 200 mg    The prior authorization has been denied. The denial notice has been scanned into Careers information officer.    Denial Reason(s):Both Sovaldi and Ribavirin were denied for the same reasons.    The request submitted was missing pertinent information.  Please have your provider resubmit the request with the following information:  Does the patient have a diagnosis of hepatocellular carcinoma that meets [place] criteria? YES  NO  Does the patient have decompensated liver disease (Child-Pugh Class B or C)? YES  NO  Is there documentation that the patient has not actively participated in illicit substance abuse for at lease six (6) consecutive months? YES  NO  Has the patient been assessed for potential non-adherence? YES  NO  Is that the patient is currently taking any medications that decrease the concentration of sofosbuvir (e.g. Carbamazepine, phenytoin, phenobarbital, oxcarbazepine, rifabutin, rifampin, rifapentine, St. John's wort, tipranavir/ritonavir [Aptivus/Norvir], efavirenz/emtricitabine/tenofovir [Atripla])? YES  NO  Does the patient have severe renal impairment, end stage renal disease, or require dialysis? YES  NO        Please answer questions above so that they can be sent back to [employer].

## 2013-01-16 NOTE — Progress Notes (Signed)
Received another request for additional information on Sovaldi PA.    Please answer question #10 on page 4 and any other questons that stem from the answer to that question.  Please note: Signature is required on page 2 and page 5.    Please print out form to be reviewed, signed, and faxed to the insurance company.      Please scan signed form back into Careers information officer after faxing.

## 2013-01-21 NOTE — Progress Notes (Signed)
PRIOR AUTHORIZATION FOR Sovaldi 400 mg    -The PA has been APPROVED on 01/21/13 for 30 ts per month for 24 weeks and will expire on 07/08/2013.  -The patient and the pharmacy have not been notified yet since the ribavirin medication is still missing.   -Approval notice was scanned into Media Manager      Please note [employer] has not received a resubmitted PA for Ribavirin, called and spoke to [place] who stated he only sees record of both previous DENIALS. Please resubmit the PA for Ribavirin.

## 2013-01-21 NOTE — Progress Notes (Signed)
Resubmitted PA for Ribavirin, signed and completed form sent for scanning.

## 2013-01-22 MED ORDER — SOFOSBUVIR 400 MG PO TABS
400.0000 mg | ORAL_TABLET | Freq: Every day | ORAL | Status: AC
Start: 2013-01-22 — End: 2013-08-24

## 2013-01-22 MED ORDER — RIBAVIRIN 200 MG PO TABS
1200.0000 mg/d | ORAL_TABLET | Freq: Two times a day (BID) | ORAL | Status: DC
Start: 2013-01-22 — End: 2013-02-21

## 2013-01-22 NOTE — Progress Notes (Signed)
Prior Authorization for Ribavirin has been APPROVED.  Insurance: [employer]  ID#: [id number]    PA # [id number] has been approved and is effective until 07/09/2013.  The patient and their preferred pharmacy has been informed of the approval.    Spoke with patient who would prefer that both medications be sent to [facility] specialty pharmacy. Contacted [employer] @ [phone] and spoke with representative Morrie Sheldon who was able to register patient. They will require that prescriptions for both Sovaldi & Ribavirin be faxed to them @ [phone] at which point they will contact patient to establish coverage and delivery.    Approval notice will be scanned into Careers information officer.

## 2013-01-23 ENCOUNTER — Ambulatory Visit (HOSPITAL_BASED_OUTPATIENT_CLINIC_OR_DEPARTMENT_OTHER): Payer: No Typology Code available for payment source | Admitting: Nurse Practitioner

## 2013-01-31 ENCOUNTER — Telehealth (HOSPITAL_BASED_OUTPATIENT_CLINIC_OR_DEPARTMENT_OTHER): Payer: Self-pay | Admitting: Registered Nurse

## 2013-01-31 NOTE — Telephone Encounter (Signed)
Message copied by Delcie Roch on Thu Jan 31, 2013 12:43 PM  ------       Message from: Roxan Diesel       Created: Thu Jan 31, 2013 11:21 AM       Regarding: Questions                       Mazelle Huebert [id number], 27 year old, female, Telephone Information:       Home Phone      [phone]       Work Phone      Not on file.       Mobile          [phone]                     Patient's Preferred Pharmacy:               [id number], [place] - [address] AT [facility] OF [address] & [address]       Phone: [phone] Fax: [phone]              [place] [facility] ([facility])       Phone: [phone] Fax: [phone]                     CONFIRMED TODAY: Lovett Sox NUMBER: [phone]       Best time to call back: Anytime       Cell phone:        Other phone:              Available times:              Patient's language of care: English              Patient does not need an interpreter.              Patient's PCP: Alton Revere MD              Person calling on behalf of patient: Patient (self)              Calls today to speak to nurse only. The patient calls today wanting to speak with a nurse in regards to questions she has about her medications.                       ------

## 2013-02-05 NOTE — Progress Notes (Signed)
Patient finally received her meds last week.  Has appt 7/16 with Dr Damita Dunnings. Will bring meds to appt.

## 2013-02-06 ENCOUNTER — Encounter (HOSPITAL_BASED_OUTPATIENT_CLINIC_OR_DEPARTMENT_OTHER): Payer: Self-pay | Admitting: Infectious Diseases

## 2013-02-06 ENCOUNTER — Ambulatory Visit (HOSPITAL_BASED_OUTPATIENT_CLINIC_OR_DEPARTMENT_OTHER): Payer: No Typology Code available for payment source | Admitting: Infectious Diseases

## 2013-02-06 VITALS — BP 118/77 | HR 88 | Temp 97.6°F | Wt 161.0 lb

## 2013-02-06 DIAGNOSIS — B1920 Unspecified viral hepatitis C without hepatic coma: Secondary | ICD-10-CM

## 2013-02-06 DIAGNOSIS — F17200 Nicotine dependence, unspecified, uncomplicated: Secondary | ICD-10-CM

## 2013-02-06 DIAGNOSIS — F1910 Other psychoactive substance abuse, uncomplicated: Secondary | ICD-10-CM

## 2013-02-06 LAB — URINE PREGNANCY TEST (POINT OF CARE): HCG QUALITATIVE URINE: NEGATIVE

## 2013-02-06 NOTE — Progress Notes (Signed)
[facility]  ID:  Sara Blair is a 27 year old woman who returns for evaluation and treatment of HCV infection  PCP: STAHL,BONNI MD    HISTORY OF PRESENT ILLNESS  Date of diagnosis: 2007  Risk factor:  IVDU  Estimated date of acquisition: First needle use 2006  HCV Genotype: 3 (history of 1a)  HCV Fibrosis Stage: FIB4 < F2 using 5/14 laboratory values  Imaging: [place] 5/14 without focal hepatic lesion but with altered waveform in portal vein -> 6 month f/u recommended  HCV Treatment History: no  HCV viral load: 5.2 million in 5/14   HIV status: negative 6/13  HAV status: needs to be immunized  HBV status: immune 6/13  HCV Knowledge:"It's permanent, it's blood-borne, and if I don't keep myself healthy it can become very bad."  Alcohol use: None   PA has been granted for 24 week course of SOF/RBV.  She is eager to start treatment and does not forsee any difficulties with adherence.         MEDICATIONS  Wellbutrin  OCP  MVI  Topical benzoyl peroxide 5% bid  Topical clindamycin bid    PAST MEDICAL HISTORY  GERD  Tobacco Use Disorder  Acne  Appendectomy    MENTAL HEALTH HISTORY  Polysubstance Use, drugs of choice were heroin and cocaine, stopped in February, 2014 with assistance of suboxone which she purchased on street for several months prior to stopping. Not in a formal drug recovery program and denies any cravings      REPRODUCTIVE STATUS  Contraception: OCP  Sexually active with BF who is HCV negative.  They practice anal and vaginal intercourse    ROS  Concerned about acne.    Has been told by PCP that she is on optimal OCP for this issue -- she has not tried yaz or yasmin in past  Uses topical benzoyl peroxide and clindamyin BID and pleased with result.  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 Smoking 2-3 cigarettes per day.   Interested in stopping again "to avoid wrinkles"        PHYSICAL EXAM  BP 118/77   Pulse 88   Temp(Src) 97.6 F (36.4 C) (Temperature probe)   Wt 161 lb (73.029 kg)   BMI 26.79 kg/m2   SpO2 98%   LMP 01/23/2013    Laboratory Test Results  Component      Latest Ref Rng 12/05/2012   WHITE BLOOD CELL      4.0 - 11.0 TH/uL 3.5 (L)   RED BLOOD CELL COUNT      3.90 - 5.20 M/uL 4.10   HEMOGLOBIN      11.2 - 15.7 g/dL 42.5   HEMATOCRIT      34.1 - 44.9 % 37.3   MEAN CORPUSCULAR VOL      80.0 - 100.0 fL 91.0   MEAN CORPUSCULAR HGB      26.0 - 34.0 pg 31.0   MEAN CORP HGB CONC      31.0 - 37.0 g/dL 95.6   RBC DISTRIBUTION WIDTH STD DEV      35.1 - 46.3 fL 42.5   RBC DISTRIBUTION WIDTH      11.5 - 14.3 % 12.6   PLATELET COUNT      150 - 400 TH/uL 212   MEAN PLATELET VOLUME  8.7 - 12.5 fL 10.3   TOTAL PROTEIN      6.4 - 8.2 g/dL 7.2   ALBUMIN      3.4 - 5.0 g/dL 4.0   BILIRUBIN TOTAL      0.2 - 1.0 mg/dL 0.3   BILIRUBIN DIRECT      0.0 - 0.2 mg/dl 0.1   BILIRUBIN INDIRECT      0.2 - 0.9 mg/dL 0.2   ALKALINE PHOSPHATASE      45 - 117 U/L 59   ASPARTATE AMINOTRANSFERASE (AST)      8 - 34 U/L 157 (H)   ALANINE AMINOTRANSFERASE (ALT)      12 - 45 U/L 293 (H)   PROTHROMBIN TIME      9.5 - 11.5 SECONDS 10.0   INR      2.0 - 3.5 < 1.0 (L)   HEPATITIS C RNA PCR IU      . IU/mL [id number]   HEPATITIS C RNA PCR LOG 10      .   6.721   BLOOD UREA NITROGEN      7 - 18 mg/dL 12   CREATININE      0.4 - 1.2 mg/dL 0.5   ALPHA FETOPROTEIN      0.0 - 8.0 ng/mL 4.8   HEPATITIS C VIRUS GENOTYPE      See Note 3         ASSESSMENT and COUNSELING  Sara Blair is a 27 year old female with chronic HCV infection. She has likely been infected for less than ten years.  Her history suggests she was initially infected with genotype 1a which she spontaneously cleared and then was re-infected with genotype 3. FIB-4 score does not suggest advanced fibrosis.     The natural history of untreated HCV and benefits of HCV treatment were reviewed.  Sara Blair was  counseled that achievement of sustained virologic response with medication is associated with decreased rates of liver-related death, liver related morbidity, need for liver transplantation, and incidence of hepatocellular carcinoma. We reviewed recent data which suggests a 90% response rate to treatment with sofosbuvir and ribavirin for 24 weeks.        Presentation today o/w remarkable for relapse in tobacco use    Recommendations  Begin 24 week course of SOF/RBV in 4 days (to complete therapy 07/28/12  Reviewed proper usage of medications and potential side effects including anemia, insomnia, HA, fatigue, nausea  Stressed importance of not becoming pregnant before during or for 6 months after treatment with ribavirin  Pregnancy test today and periodically during rx  Encouraged AA or NA meetings to support sobriety  Tobacco counseling:  Reviewed health rewards of quitting and provided her with written information on same as well as with contact information for [facility] tobacco treatment.            I have spent 15 minutes in face to face time with this patient/patient proxy of which > 50% was in counseling or coordination of care regarding above issues/Dx.

## 2013-02-13 ENCOUNTER — Telehealth (HOSPITAL_BASED_OUTPATIENT_CLINIC_OR_DEPARTMENT_OTHER): Payer: Self-pay | Admitting: Registered Nurse

## 2013-02-13 NOTE — Progress Notes (Signed)
Pt calls today c/o mild nausea and HA after starting sovaldi.  Reports no vomiting or diarrhea  Is taking med with food  Pt asks about taking ibuprophen  Discussed with pt, ibuprophen may add to GI discomfort  Pt will try otc anti nausea medication  Will review with provider if other options are more efficacious.

## 2013-02-13 NOTE — Telephone Encounter (Signed)
Message copied by Darel Hong on Wed Feb 13, 2013 11:35 AM  ------       Message from: Trion, NADERGE       Created: Wed Feb 13, 2013 11:18 AM                       Shanicka Oldenkamp [id number], 27 year old, female, Telephone Information:       Home Phone      [phone]       Work Phone      Not on file.       Mobile          [phone]                     Patient's Preferred Pharmacy:               [id number] - [place], [place] - [address] AT [facility] [address] [place]       Phone: [phone] Fax: [phone]              [facility]       Phone: [phone] Fax: [phone]                     CONFIRMED TODAY: Lovett Sox NUMBER: [id number]       Best time to call back:               Cell phone:        Other phone:              Available times:              Patient's language of care: English              Patient does not need an interpreter.              Patient's PCP: Alton Revere MD              Person calling on behalf of patient: Patient (self)              Calls today with questions and concerns BECAUSE PT STARTED HER SOVOLDI  MEDICATION AND SHE IS FEELING NAUSEOUS, WANTS TO KNOW IF SHE CAN HAVE SOMETHING OVER THE COUNTER LIKE AN ANTI NAUSEA...                       ------

## 2013-02-15 ENCOUNTER — Other Ambulatory Visit (HOSPITAL_BASED_OUTPATIENT_CLINIC_OR_DEPARTMENT_OTHER): Payer: Self-pay | Admitting: Internal Medicine

## 2013-02-15 DIAGNOSIS — F17200 Nicotine dependence, unspecified, uncomplicated: Secondary | ICD-10-CM

## 2013-02-15 MED ORDER — BUPROPION HCL ER (SR) 100 MG PO TB12
100.0000 mg | ORAL_TABLET | Freq: Two times a day (BID) | ORAL | Status: DC
Start: 2013-02-15 — End: 2014-02-16

## 2013-02-15 NOTE — Progress Notes (Signed)
Person calling on behalf of patient: Patient (self)    Sara Blair is a 27 year old female       - medication(s) request: Wellbutrin SR 100mg  tab  - last office visit: 02/06/13  - last physical exam: 01/06/12      Other Med Adult:  Most Recent BP Reading(s)  02/06/13 : 118/77      Cholesterol (mg/dl)   Date  Value    1/61/0960  201*   ----------  LDL DIRECT (mg/dl)   Date  Value    4/54/0981  124*   ----------  HDL (mg/dl)   Date  Value    1/91/4782  52    ----------  TRIGLYCERIDES (mg/dl)   Date  Value    9/56/2130  80    ----------      No results found for this basename: TSHSC      No results found for this basename: TSH    No results found for this basename: hgba1c      INR (no units)   Date  Value    12/05/2012  < 1.0*   ----------           Documented patient preferred pharmacies:    [id number] - [place], [place] - [address] AT [facility] [address] [place]  Phone: [phone] Fax: [phone]

## 2013-02-15 NOTE — Progress Notes (Signed)
[facility] INTERNAL MED    Person calling on behalf of patient: Patient (self)    May list multiple medications in this section    Medicine Name: [facility]    Dosage:     Frequency (how many pills, how many times a day):     Number of pills left:     Documented patient preferred pharmacies:   [id number] - [place], [place] - [address] AT [facility] [address]   Phone: [phone] Fax: [phone]    Patient's language of care: English    Patient needs NO interpreter.

## 2013-02-20 ENCOUNTER — Ambulatory Visit (HOSPITAL_BASED_OUTPATIENT_CLINIC_OR_DEPARTMENT_OTHER): Payer: No Typology Code available for payment source | Admitting: Infectious Diseases

## 2013-02-20 ENCOUNTER — Encounter (HOSPITAL_BASED_OUTPATIENT_CLINIC_OR_DEPARTMENT_OTHER): Payer: Self-pay | Admitting: Infectious Diseases

## 2013-02-20 VITALS — BP 122/68 | HR 70 | Temp 98.2°F | Resp 18 | Wt 160.0 lb

## 2013-02-20 DIAGNOSIS — B1920 Unspecified viral hepatitis C without hepatic coma: Secondary | ICD-10-CM

## 2013-02-20 LAB — HEMATOCRIT: HEMATOCRIT: 32.6 % — ABNORMAL LOW (ref 34.1–44.9)

## 2013-02-20 LAB — URINE PREGNANCY TEST (POINT OF CARE): HCG QUALITATIVE URINE: NEGATIVE

## 2013-02-20 NOTE — Progress Notes (Signed)
[facility]  ID:  Sara Blair is a 27 year old woman who returns for f/u on HCV treatment  PCP: STAHL,BONNI MD    HISTORY OF PRESENT ILLNESS  Date of diagnosis: 2007  Risk factor:  IVDU  Estimated date of acquisition: First needle use 2006  HCV Genotype: 3 (history of 1a)  HCV Fibrosis Stage: FIB4 < F2 using 5/14 laboratory values  Imaging: [place] 5/14 without focal hepatic lesion but with altered waveform in portal vein -> 6 month f/u recommended  HCV Treatment History: no  HCV viral load: 5.2 million in 5/14   HIV status: negative 6/13  HAV status: needs to be immunized  HBV status: immune 6/13  HCV Knowledge:"It's permanent, it's blood-borne, and if I don't keep myself healthy it can become very bad."  Alcohol use: None   Initiated sofosbuvir and ribavirin on July 20th.  A 24 week course is planned (through Jul 28, 2013).  She was fatigued and had HA during first week of treatment.  Now only symptom is pruritis.  Denies any light-headedness or dyspnea.  Denies missed doses. Vaginal bleeding started this AM.  Believes that timing is correct for OCP-withdrawal bleed but notes that amount of bleeding is excessive.     MEDICATIONS  Wellbutrin  OCP  MVI  Topical benzoyl peroxide 5% bid  Topical clindamycin bid  Sofosbuvir   Ribavirin 600 mg BID    PAST MEDICAL HISTORY  GERD  Tobacco Use Disorder  Acne  Appendectomy    MENTAL HEALTH HISTORY  Polysubstance Use, drugs of choice were heroin and cocaine, stopped in February, 2014 with assistance of suboxone which she purchased on street for several months prior to stopping. Not in a formal drug recovery program and denies any cravings      REPRODUCTIVE STATUS  Contraception: OCP  Sexually active with BF who is HCV negative.  They practice anal and vaginal intercourse    ROS  Concerned about acne.    Has been told by PCP that she is on optimal OCP for this issue -- she has not tried yaz or yasmin in past  Uses topical benzoyl peroxide and  clindamyin BID and pleased with result.  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 Smoking 2-3 cigarettes per day.  Interested in stopping again "to avoid wrinkles"        PHYSICAL EXAM  BP 122/68   Pulse 70   Temp(Src) 98.2 F (36.8 C) (Temporal)   Resp 18   Wt 160 lb (72.576 kg)   BMI 26.63 kg/m2   SpO2 97%   LMP 02/20/2013  No skin rash      Laboratory Test Results  Component      Latest Ref Rng 12/05/2012   WHITE BLOOD CELL      4.0 - 11.0 TH/uL 3.5 (L)   RED BLOOD CELL COUNT      3.90 - 5.20 M/uL 4.10   HEMOGLOBIN      11.2 - 15.7 g/dL 24.4   HEMATOCRIT      34.1 - 44.9 % 37.3   MEAN CORPUSCULAR VOL      80.0 - 100.0 fL 91.0   MEAN CORPUSCULAR HGB      26.0 - 34.0 pg 31.0   MEAN CORP HGB CONC      31.0 -  37.0 g/dL 16.1   RBC DISTRIBUTION WIDTH STD DEV      35.1 - 46.3 fL 42.5   RBC DISTRIBUTION WIDTH      11.5 - 14.3 % 12.6   PLATELET COUNT      150 - 400 TH/uL 212   MEAN PLATELET VOLUME      8.7 - 12.5 fL 10.3   TOTAL PROTEIN      6.4 - 8.2 g/dL 7.2   ALBUMIN      3.4 - 5.0 g/dL 4.0   BILIRUBIN TOTAL      0.2 - 1.0 mg/dL 0.3   BILIRUBIN DIRECT      0.0 - 0.2 mg/dl 0.1   BILIRUBIN INDIRECT      0.2 - 0.9 mg/dL 0.2   ALKALINE PHOSPHATASE      45 - 117 U/L 59   ASPARTATE AMINOTRANSFERASE (AST)      8 - 34 U/L 157 (H)   ALANINE AMINOTRANSFERASE (ALT)      12 - 45 U/L 293 (H)   PROTHROMBIN TIME      9.5 - 11.5 SECONDS 10.0   INR      2.0 - 3.5 < 1.0 (L)   HEPATITIS C RNA PCR IU      . IU/mL [id number]   HEPATITIS C RNA PCR LOG 10      .   6.721   BLOOD UREA NITROGEN      7 - 18 mg/dL 12   CREATININE      0.4 - 1.2 mg/dL 0.5   ALPHA FETOPROTEIN      0.0 - 8.0 ng/mL 4.8   HEPATITIS C VIRUS GENOTYPE      See Note 3         ASSESSMENT and COUNSELING    Sara Blair is a 27 year old female with chronic HCV infection. She has likely been infected for less than ten years.   Her history suggests she was initially infected with genotype 1a which she spontaneously cleared and then was re-infected with genotype 3. FIB-4 score does not suggest advanced fibrosis.     She initiated sofosbuvir/ribavirin on July 20 and has now completed 10 days of therapy.  She is tolerating well except for pruritis without associated rash.  She also notes that period started this AM and that amount of bleeding is excessive compared with usual OCP withdrawal bleed.        Recommendations  Continue 24 week course of SOF/RBV in 4 days (to complete therapy 07/28/12)  Reviewed proper usage of medications   Suggested increasing hydration and fragrance free emollient for pruritis.  Stressed importance of not becoming pregnant before during or for 6 months after treatment with ribavirin  Pregnancy test today   Hct today  RTC 03/06/13 at which time we will measure HCV RAN        I have spent 15 minutes in face to face time with this patient/patient proxy of which > 50% was in counseling or coordination of care regarding above issues/Dx.

## 2013-02-21 ENCOUNTER — Telehealth (HOSPITAL_BASED_OUTPATIENT_CLINIC_OR_DEPARTMENT_OTHER): Payer: Self-pay | Admitting: Infectious Diseases

## 2013-02-21 DIAGNOSIS — B1920 Unspecified viral hepatitis C without hepatic coma: Secondary | ICD-10-CM

## 2013-02-21 NOTE — Progress Notes (Signed)
Patient returned call, confirmed RBV dose reduction

## 2013-02-21 NOTE — Progress Notes (Addendum)
Left message advising her to decrease RBV to 400 mg BID.  Asked her to return call to discuss further    [facility] LABS Latest Ref Rng 12/05/2012 02/20/2013   HCT 34.1 - 44.9 % 37.3 32.6 (L)

## 2013-02-22 ENCOUNTER — Telehealth (HOSPITAL_BASED_OUTPATIENT_CLINIC_OR_DEPARTMENT_OTHER): Payer: Self-pay | Admitting: Registered Nurse

## 2013-02-22 NOTE — Progress Notes (Signed)
Message from answering service that patient called at 7:05 AM complaining of severe SOB and palpitations.  Left message on VM instructing patient to call 911 or go to ED if she has CP, SOB, or palpitations.  Patient returned call. States she has severe CP. She is next door to [facility] and will go to ED.

## 2013-02-26 ENCOUNTER — Telehealth (HOSPITAL_BASED_OUTPATIENT_CLINIC_OR_DEPARTMENT_OTHER): Payer: Self-pay | Admitting: Internal Medicine

## 2013-02-26 NOTE — Progress Notes (Signed)
[facility] INTERNAL MED    Person calling on behalf of patient: Pharmacy    May list multiple medications in this section    Medicine Name: Ortho-tri cyclen lo tabs    Dosage:     Frequency (how many pills, how many times a day):     Number of pills left:     Documented patient preferred pharmacies:   [id number] - [place], [place] - [address] AT [facility] OF [address] [place]  Phone: [phone] Fax: [phone]

## 2013-03-01 ENCOUNTER — Telehealth (HOSPITAL_BASED_OUTPATIENT_CLINIC_OR_DEPARTMENT_OTHER): Payer: Self-pay

## 2013-03-01 NOTE — Progress Notes (Signed)
Patient reports she has been experiencing her teeth shattering as if she has a cold, she's been having a non-productive cough since Wednesday. Her temperature reading has been 96 and her normal temperature runs around 98.  Since yesterday she has been feeling pressure on her chest and on a scale of 0-10 she rates the pressure at a 5. She reports she has been to the ER last week for chest pain and SOB but they did not find anything.  Patient reports she started on Robitussin and Ibuprofen this morning.  She questions whether these symptoms are being triggered by her Hepatitis C medications; Ribavirin and Sovaldi.

## 2013-03-01 NOTE — Progress Notes (Signed)
After discussing patients symptoms with Provider on duty called patient back, but the call went to voice mail.  Message and phone number left on voice mail asking caller to call triage nurse.

## 2013-03-01 NOTE — Progress Notes (Signed)
Called patient again to discuss her options.  She was again advised to seek medical attention at an ER, especially for the chest pain.  This was first suggested to the patient in our initial conversation but she was not interested in going to an ER.  After informing the patient that the doctor also advise she go to the ER she still was not keen on taking that advise.  Patient said she went to [facility] last week since it is near her job and they found nothing.  Patient says she has an appointment with Dr. Damita Dunnings on Wednesday.  Patient advised to seek medical care in an ER because they may have missed something last week  She verbally agreed but did not sound convinced that she was going to an ER.

## 2013-03-01 NOTE — Telephone Encounter (Signed)
Message copied by Francisca December on Fri Mar 01, 2013  9:36 AM  ------       Message from: [place], [place]       Created: Fri Mar 01, 2013  9:17 AM                       Bess Harvest [id number], 27 year old, female, Telephone Information:       Home Phone      [phone]       Work Phone      Not on file.       Mobile          [phone]                     Patient's Preferred Pharmacy:               [facility] - [place], [place] - [address] AT [facility] OF [address] [place]       Phone: [phone] Fax: [phone]                     CONFIRMED TODAY: Yes              CALL BACK NUMBER:  [phone]       Best time to call back:        Cell phone:        Other phone:              Available times:              Patient's language of care: English              Patient does not need an interpreter.              Patient's PCP: Alton Revere MD              Person calling on behalf of patient: Patient (self)              Calls today with questions and concerns. Teeth and jaw has been quivering not sure if its side effects from medication...                       ------

## 2013-03-06 ENCOUNTER — Telehealth (HOSPITAL_BASED_OUTPATIENT_CLINIC_OR_DEPARTMENT_OTHER): Payer: Self-pay | Admitting: Infectious Diseases

## 2013-03-06 ENCOUNTER — Ambulatory Visit (HOSPITAL_BASED_OUTPATIENT_CLINIC_OR_DEPARTMENT_OTHER): Payer: No Typology Code available for payment source | Admitting: Infectious Diseases

## 2013-03-06 VITALS — BP 121/71 | HR 86 | Temp 98.6°F | Resp 18 | Wt 160.5 lb

## 2013-03-06 DIAGNOSIS — B1920 Unspecified viral hepatitis C without hepatic coma: Secondary | ICD-10-CM

## 2013-03-06 DIAGNOSIS — J988 Other specified respiratory disorders: Secondary | ICD-10-CM

## 2013-03-06 LAB — HEMATOCRIT: HEMATOCRIT: 31.6 % — ABNORMAL LOW (ref 34.1–44.9)

## 2013-03-06 NOTE — Progress Notes (Addendum)
Left message indicating that hct slightly lower but that I would advise continuing current RBV dose for now with repeat hct measurement on 03/21/13.  Encouraged her to contact the clinic with any questions.

## 2013-03-06 NOTE — Progress Notes (Signed)
[facility]  ID:  Sara Blair is a 27 year old woman who returns for f/u on HCV treatment  PCP: STAHL,BONNI MD    HISTORY OF PRESENT ILLNESS  Date of diagnosis: 2007  Risk factor:  IVDU  Estimated date of acquisition: First needle use 2006  HCV Genotype: 3 (history of 1a)  HCV Fibrosis Stage: FIB4 < F2 using 5/14 laboratory values  Imaging: [place] 5/14 without focal hepatic lesion but with altered waveform in portal vein -> 6 month f/u recommended  HCV Treatment History: no  HCV viral load: 5.2 million in 5/14   HIV status: negative 6/13  HAV status: needs to be immunized  HBV status: immune 6/13  HCV Knowledge:"It's permanent, it's blood-borne, and if I don't keep myself healthy it can become very bad."  Alcohol use: None   Initiated sofosbuvir and ribavirin on July 20th.  A 24 week course is planned (through Jul 28, 2013).  \  02/20/13:  Hct declined to 32.6 and urine HCG negative  02/21/13:  RBV dose reduced to 400 mg BID  02/22/13:  Contacted on call provider then clinic RN manager to report dyspnea and palpitations and chest pain.  Advised to go to ED.  Reportedly seen at [facility] ED.  "It was the biggest waste of time I ever experienced.  They told me nothing was wrong and made me feel like a hypochondriac"  03/01/13:  Again contacted clinic to report chest pain, dry cough, teeth shattering, and dyspnea.  Advised to return to ED and was seen at [facility] ED where she was told she had a viral illness and provided her with a decongestant.  Today reports feeling much better.   There has been no fever or chest pain or HA for 48 hours. She continues to note cough and mucous drainage from her nose as well as mild dyspnea and fatigue.   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   She has not missed any doses of sofosbuvir or ribavirin.   Skin pruritis has resolved with use of all fragrance-free skin products/emollients and with increased  hydration  She is using 2 forms of contraception - OCPs and condoms.      MEDICATIONS  Wellbutrin  OCP  MVI  Topical benzoyl peroxide 5% bid  Topical clindamycin bid  Sofosbuvir   Ribavirin 400 mg BID    PAST MEDICAL HISTORY  GERD  Tobacco Use Disorder  Acne  Appendectomy    MENTAL HEALTH HISTORY  Polysubstance Use, drugs of choice were heroin and cocaine, stopped in February, 2014 with assistance of suboxone which she purchased on street for several months prior to stopping. Not in a formal drug recovery program and denies any cravings      REPRODUCTIVE STATUS  Contraception: OCP and condoms  Sexually active with BF who is HCV negative.  They practice anal and vaginal intercourse      Social History  Employment: Housekeeper  Supports: Moved to [place] with BF  Good relationship with mother  Started smoking again 2 weeks ago when moved in with a smoker. Smoking 2-3 cigarettes per day.  Interested in stopping again "to avoid wrinkles"        PHYSICAL EXAM  BP 121/71   Pulse 86   Temp(Src) 98.6 F (37 C) (Temporal)   Resp 18   Wt 160 lb 8 oz (72.802 kg)   BMI 26.71 kg/m2   SpO2 96%  LMP 02/20/2013  Well appearing, not coughing  No sinus tenderness  Nares normal  Lungs CTA  Cor RRR no murmur  LE's without edema, calf tenderness, cords    Laboratory Test Results    Component      Latest Ref Rng 02/20/2013   HCG QUALITATIVE URINE       Negative   ONBOARD CONTROL PRESENT?       Yes   HEMATOCRIT      34.1 - 44.9 % 32.6 (L)       ASSESSMENT and COUNSELING    Sara Blair is a 27 year old female with chronic HCV infection. She has likely been infected for less than ten years.  Her history suggests she was initially infected with genotype 1a which she spontaneously cleared and then was re-infected with genotype 3. FIB-4 score does not suggest advanced fibrosis.     She initiated sofosbuvir/ribavirin on July 20 and has now completed 3.5 weeks of  Therapy. Her RBV was dose reduced from 600 mg BID to 400 mg BID on 02/21/13 d/t  decrease in hct.  Since last visit, she has had a respiratory tract infection which now seems to be resolving in absence of specific anti-infective therapy.  She did not miss any HCV medication during intercurrent illness.      Recommendations  Continue 24 week course of SOF/RBV  (to complete therapy 07/28/12)  Reviewed proper usage of medications   Stressed importance of not becoming pregnant before during or for 6 months after treatment with ribavirin  Hct today  Counseled her that fatigue and cough may persist for weeks following viral URI.   RTC 03/21/13 at which time we will measure HCV RNA        I have spent 20 minutes in face to face time with this patient/patient proxy of which > 50% was in counseling or coordination of care regarding above issues/Dx.

## 2013-03-21 ENCOUNTER — Ambulatory Visit (HOSPITAL_BASED_OUTPATIENT_CLINIC_OR_DEPARTMENT_OTHER): Payer: No Typology Code available for payment source | Admitting: Infectious Diseases

## 2013-04-01 ENCOUNTER — Telehealth (HOSPITAL_BASED_OUTPATIENT_CLINIC_OR_DEPARTMENT_OTHER): Payer: Self-pay | Admitting: Registered Nurse

## 2013-04-01 NOTE — Progress Notes (Addendum)
Patient calls saying she received generic ribavirin rather than brand name copegus and wants to be sure it will not affect her treatment.  Reassured her that it will not.

## 2013-04-01 NOTE — Progress Notes (Addendum)
CALL BACK NUMBER: [phone]

## 2013-04-11 ENCOUNTER — Telehealth (HOSPITAL_BASED_OUTPATIENT_CLINIC_OR_DEPARTMENT_OTHER): Payer: Self-pay

## 2013-04-11 NOTE — Telephone Encounter (Signed)
Message copied by Berenda Morale on Thu Apr 11, 2013  1:12 PM  ------       Message from: Roxan Diesel       Created: Thu Apr 11, 2013 11:27 AM       Regarding: Provider Only                       Sara Blair [id number], 27 year old, female, Telephone Information:       Home Phone      [phone]       Work Phone      Not on file.       Mobile          [phone]                     Patient's Preferred Pharmacy:               [facility] [place] - [address] AT [facility] [address] [place]       Phone: [phone] Fax: [phone]                     CONFIRMED TODAY: Yes              CALL BACK NUMBER: [phone]       Best time to call back: Anytime       Cell phone:        Other phone:              Available times:              Patient's language of care: English              Patient does not need an interpreter.              Patient's PCP: Alton Revere MD              Person calling on behalf of patient: Patient (self)              Calls today to speak to provider only. The patient calls today wanting to speak with someone in regards to her Dermatology appointment.                       ------

## 2013-04-11 NOTE — Progress Notes (Addendum)
S:  Made derm appt d/t have acne.  Also have had rash on elbows, hands, feet x 2 weeks.  + family hx of eczema.    Want to know if there will be any interaction of medications that I may get for my derm problems with the ribavirin and sofosbuvir.    P:  Counseling to take list of medications to Derm appt       Once she gets derm prescriptions, if has concerns, call triage nurse

## 2013-04-12 ENCOUNTER — Telehealth (HOSPITAL_BASED_OUTPATIENT_CLINIC_OR_DEPARTMENT_OTHER): Payer: Self-pay | Admitting: Internal Medicine

## 2013-04-12 DIAGNOSIS — L709 Acne, unspecified: Secondary | ICD-10-CM

## 2013-04-12 NOTE — Progress Notes (Signed)
Patient is calling for Derm referral.

## 2013-04-17 ENCOUNTER — Ambulatory Visit (HOSPITAL_BASED_OUTPATIENT_CLINIC_OR_DEPARTMENT_OTHER): Payer: PRIVATE HEALTH INSURANCE | Admitting: Infectious Diseases

## 2013-04-17 ENCOUNTER — Encounter (HOSPITAL_BASED_OUTPATIENT_CLINIC_OR_DEPARTMENT_OTHER): Payer: Self-pay | Admitting: Infectious Diseases

## 2013-04-17 VITALS — BP 117/77 | HR 72 | Temp 98.6°F | Wt 164.0 lb

## 2013-04-17 DIAGNOSIS — B1920 Unspecified viral hepatitis C without hepatic coma: Secondary | ICD-10-CM

## 2013-04-17 LAB — URINE PREGNANCY TEST (POINT OF CARE): HCG QUALITATIVE URINE: NEGATIVE

## 2013-04-17 LAB — HEMATOCRIT: HEMATOCRIT: 35.7 % (ref 34.1–44.9)

## 2013-04-17 MED ORDER — CLOTRIMAZOLE-BETAMETHASONE 1-0.05 % EX LOTN
TOPICAL_LOTION | Freq: Two times a day (BID) | CUTANEOUS | Status: AC
Start: 2013-04-17 — End: 2014-03-17

## 2013-04-17 NOTE — Progress Notes (Signed)
[facility]  ID:  Sara Blair is a 27 year old woman who returns for f/u on HCV treatment  PCP: STAHL,BONNI MD    HISTORY OF PRESENT ILLNESS  Date of diagnosis: 2007  Risk factor:  IVDU  Estimated date of acquisition: First needle use 2006  HCV Genotype: 3 (history of 1a)  HCV Fibrosis Stage: FIB4 < F2 using 5/14 laboratory values  Imaging: [place] 5/14 without focal hepatic lesion but with altered waveform in portal vein -> 6 month f/u recommended  HCV Treatment History: no  HCV viral load: 5.2 million in 5/14   HIV status: negative 6/13  HAV status: vaccine #1 6/14  HBV status: immune 6/13  HCV Knowledge:"It's permanent, it's blood-borne, and if I don't keep myself healthy it can become very bad."  Alcohol use: None     Initiated sofosbuvir and ribavirin on July 20th.  A 24 week course is planned (through Jul 28, 2013).    02/20/13:  Hct declined to 32.6 and urine HCG negative  02/21/13:  RBV dose reduced to 400 mg BID  She is using 2 forms of contraception - OCPs and condoms.    Today she reports feeling generally well except for nasal congestion. She denies abdominal pain, nausea, dyspnea, light-headedness, palpitations.          MEDICATIONS  Wellbutrin  OCP  MVI  Topical benzoyl peroxide 5% bid  Topical clindamycin bid  Sofosbuvir   Ribavirin 400 mg BID    PAST MEDICAL HISTORY  GERD  Tobacco Use Disorder  Acne  Appendectomy    MENTAL HEALTH HISTORY  Polysubstance Use, drugs of choice were heroin and cocaine, stopped in February, 2014 with assistance of suboxone which she purchased on street for several months prior to stopping. Not in a formal drug recovery program and denies any cravings      REPRODUCTIVE STATUS  Contraception: OCP and condoms  Sexually active with BF who is HCV negative.  They practice anal and vaginal intercourse      Social History  Employment: Housekeeper  Supports: Moved to [place] with BF  Good relationship with mother  Started  smoking again 2 weeks ago when moved in with a smoker. Smoking 2-3 cigarettes per day.  Interested in stopping again "to avoid wrinkles"        PHYSICAL EXAM  BP 117/77   Pulse 72   Temp(Src) 98.6 F (37 C) (Temporal)   Wt 164 lb (74.39 kg)   BMI 27.29 kg/m2   SpO2 97%   LMP 04/17/2013        ASSESSMENT and COUNSELING    Sara Blair is a 27 year old female with chronic HCV infection. She has likely been infected for less than ten years.  Her history suggests she was initially infected with genotype 1a which she spontaneously cleared and then was re-infected with genotype 3. FIB-4 score does not suggest advanced fibrosis.     She initiated sofosbuvir/ribavirin on July 20/14. Her RBV was dose reduced from 600 mg BID to 400 mg BID on 02/21/13 d/t decrease in hct.  She is tolerating treatment well.         Recommendations  Continue 24 week course of SOF/RBV  (to complete therapy 07/28/12)  Reviewed proper usage of medications   Stressed importance of not becoming pregnant before during or for 6 months after treatment with ribavirin  Hct today  Urine pregnancy test today  HCV RNA today  Due for repeat liver US in 11/14 --  will discuss at next visit  Due for second HAV vaccine in 1/15          I have spent 15 minutes in face to face time with this patient/patient proxy of which > 50% was in counseling or coordination of care regarding above issues/Dx.

## 2013-04-22 ENCOUNTER — Encounter (HOSPITAL_BASED_OUTPATIENT_CLINIC_OR_DEPARTMENT_OTHER): Payer: Self-pay | Admitting: Student in an Organized Health Care Education/Training Program

## 2013-04-22 ENCOUNTER — Telehealth (HOSPITAL_BASED_OUTPATIENT_CLINIC_OR_DEPARTMENT_OTHER): Payer: Self-pay

## 2013-04-22 ENCOUNTER — Encounter (HOSPITAL_BASED_OUTPATIENT_CLINIC_OR_DEPARTMENT_OTHER): Payer: Self-pay | Admitting: Infectious Diseases

## 2013-04-22 ENCOUNTER — Ambulatory Visit (HOSPITAL_BASED_OUTPATIENT_CLINIC_OR_DEPARTMENT_OTHER): Payer: PRIVATE HEALTH INSURANCE | Admitting: Student in an Organized Health Care Education/Training Program

## 2013-04-22 VITALS — BP 118/74 | Temp 99.0°F | Wt 161.8 lb

## 2013-04-22 DIAGNOSIS — F17200 Nicotine dependence, unspecified, uncomplicated: Secondary | ICD-10-CM

## 2013-04-22 DIAGNOSIS — J069 Acute upper respiratory infection, unspecified: Secondary | ICD-10-CM

## 2013-04-22 LAB — HEPATITIS C RNA PCR: HEPATITIS C RNA PCR IU: NOT DETECTED IU/mL

## 2013-04-22 MED ORDER — PSEUDOEPHEDRINE HCL 60 MG PO TABS
60.00 mg | ORAL_TABLET | Freq: Three times a day (TID) | ORAL | Status: AC
Start: 2013-04-22 — End: 2013-05-22

## 2013-04-22 MED ORDER — NICOTINE POLACRILEX 4 MG MT GUM
4.00 mg | CHEWING_GUM | OROMUCOSAL | Status: AC | PRN
Start: 2013-04-22 — End: 2013-05-22

## 2013-04-22 NOTE — Progress Notes (Addendum)
Discussed with PCP, needs evaluation in office, scheduled to see dr Gilford Raid who will see her with dr Eppie Gibson

## 2013-04-22 NOTE — Progress Notes (Addendum)
Call to patient states was seen at [facility] in [place] over weekend  Told she had bronchitis and sinusitis  Given scripts for doxycycline and azythromycin and pharmacy would not fill because told they interact with hep c meds  Calling to see if dr Eppie Gibson can prescribe something else  Is willing to come in if she has to.

## 2013-04-22 NOTE — Telephone Encounter (Signed)
Message copied by Sharlyne Pacas on Mon Apr 22, 2013 11:38 AM  ------       Message from: Luan Moore       Created: Mon Apr 22, 2013 11:31 AM       Regarding: sick call/antibiotics        Contact: [phone]         Sara Blair is a 27 year old old female.              Patient's PCP: STAHL,BONNI MD              In case we get disconnected what is the best number to reach you at today:       Home Phone [phone] (home)              Person calling:       Patient (self)              How can I help you today:        Sick Call:         What is your main complaint today       Seen in urgent care this weekend they said she has bronchitis, was given 2 types of antibiotics pt can't take them because she is on hep c meds wants to know if Dr.Stahl can give her something she can take with other meds.               The nurse will call you back within one hour.  She may call you at anytime. Can you remain at your telephone for the next 60 minutes?       Yes                       Patient's language of care: English              Would you like an interpreter when the nurse calls you back?       NO  ------

## 2013-04-22 NOTE — Progress Notes (Signed)
[facility]    CC:  Cough, congestion    HPI: Sara Blair is a 27 year old female who presents to clinic regarding her cough, congestion.  She reports that she hasn't been feeling well since a week ago Thursday.  She has felt run down and developed a stuffy nose Monday and Tuesday.  She has taken a number of over-the-counter medications without relief.  She continues to have head stuffiness, an achiness in her forehead, ears, joints.  She is a bit short of breath with exertion and is coughing up green and yellow phlegm.  She has also felt chilled and sweaty and believes she had a fever over the weekend but hasn't taken her temperature.      No exacerbating or relieving factors.  She reports that she had similar symptoms when she started her HCV but they went away. She tried penicillin, which she had in her medicine chest, and it didn't help.        While here, she would like a dermatology referral and is interested in trying the nicotine gum to quit smoking.      Review of systems:     Constitutional: + chills, night sweats.    ENT: No change in hearing   Resp: + SOB, no wheezing, + productive cough   CV: No chest pain, palpitations   GI: No nausea, diarrhea, constipation   GU: No dysuria, hematuria,   Neuro: No numbness  Skin: + rashes   MSK: + body aches     Patient Active Problem List:     Unspecified viral hepatitis C without hepatic coma     Opiate dependence     Polysubstance abuse     Tobacco use disorder     Multiple lung nodules     Esophageal reflux     Other acne      Allergies: Review of Patient's Allergies indicates:   Penicillins             Rash    Medications:   Current Outpatient Prescriptions on File Prior to Visit:  clotrimazole-betamethasone (LOTRISONE) lotion Apply  topically 2 (two) times daily. Disp: 30 mL Rfl: 0   ribavirin (COPEGUS) 200 MG tablet Take 2 tablets by mouth 2 (two) times daily. Disp: 180 tablet Rfl: 5   buPROPion (WELLBUTRIN SR) 100 MG 12 hr tablet Take 1  tablet by mouth 2 (two) times daily. Disp: 180 tablet Rfl: 3   sofosbuvir (SOVALDI) 400 MG tablet Take 1 tablet by mouth daily. Disp: 30 tablet Rfl: 5   clindamycin-tretinoin (ZIANA) gel Apply  topically nightly. Disp: 1 tube Rfl: 0   naproxen (NAPROSYN) 500 MG tablet Take 1 tablet by mouth 2 (two) times daily with meals. For 5 days. Disp: 60 tablet Rfl: 0   norgestimate-ethinyl estradiol triphasic (ORTHO TRI-CYCLEN LO) 0.18/0.215/0.25 MG-25 MCG per tablet Take 1 tablet by mouth daily. Disp: 28 tablet Rfl: 11   clindamycin (CLEOCIN T) 1 % lotion Apply  topically 2 (two) times daily. Disp: 28 mL Rfl: 6     No current facility-administered medications on file prior to visit.    Family/Social History:     Social History Narrative    Living women place half way house.         08/09/12    Patient is from [place]. She is living with her boyfriend for 06 months. She has been working as Advertising copywriter. No children. She is in drug recovery program.  family history includes Cancer - Breast (age of onset: 70) in her sister and Cancer - Breast (age of onset: 53) in her sister.    Exam  BP 118/74   Temp(Src) 99 F (37.2 C)   Wt 161 lb 12.8 oz (73.392 kg)   BMI 26.92 kg/m2   LMP 04/17/2013    Gen: Pleasant woman in NAD.   HEENT:  No pain on palpation of sinuses, throat with minimal erythema, no exudate.  TMs clear, some retraction.  Both maxillary sinuses with good transillumination.   Neck: Painful cervical lymphadenopathy  Lung: CTAB  CV: RRR  Abd: Epigastric tenderness, NABS  Ext: No edema, 2+ DP pulses  Neuro/Psych:  Normal affect    Assessment and Plan    1. Acute upper respiratory infections of unspecified site  No evidence of ear infection or sinus infection.   This is likely viral URI, which will resolve on its own.  Recommend that she call in 2-3 days if no improvement or if fever > 101.  Also recommend sudafed TID, motrin TID, and benadryl in the evenings.  If symptoms persist would recommend  levofloxacin as it doesn't interact with other medications.   Also recommend continuing flonase and using neti pot.      2. Tobacco use disorder:  Quit for two months and started again when she was low on money and had significant stress.  Reports that she would like to try the gum to augment the wellbutrin.      This encounter was reviewed with Dr. Eppie Gibson who agrees with assessment and plan above.

## 2013-04-23 NOTE — Progress Notes (Signed)
PRECEPTOR NOTE  On the day of the patient's visit, I personally saw and evaluated the patient. In addition, I reviewed findings with the resident.  I confirm the key elements of history and physical exam as described in resident's note.  I agree with the assessment and plan as described above.  Please see resident's note for further details.

## 2013-05-07 ENCOUNTER — Telehealth (HOSPITAL_BASED_OUTPATIENT_CLINIC_OR_DEPARTMENT_OTHER): Payer: Self-pay | Admitting: Registered Nurse

## 2013-05-07 NOTE — Progress Notes (Signed)
Called to ask if it is Ok for her to have flu vaccine at work.  Advised her that there are no contraindications.

## 2013-05-07 NOTE — Telephone Encounter (Signed)
Message copied by Delcie Roch on Tue May 07, 2013 10:53 AM  ------       Message from: Mosetta Pigeon       Created: Tue May 07, 2013  9:14 AM                       Bess Harvest [id number], 27 year old, female, Telephone Information:       Home Phone      [phone]       Work Phone      Not on file.       Mobile          [phone]                     Patient's Preferred Pharmacy:               [id number] - [place], [place] - [address] AT [facility] [address]        Phone: [phone] Fax: [phone]              [id number] - [place], [place] - [address] AT [facility] [address]       Phone: [phone] Fax: [phone]                     CONFIRMED TODAY: Yes              CALL BACK NUMBER:   [phone]       Best time to call back:        Cell phone:        Other phone:              Available times:              Patient's language of care: English              Patient does not need an interpreter.              Patient's PCP: Alton Revere MD              Person calling on behalf of patient: Patient (self)              Calls today with questions and concerns wants a flu shot                       ------

## 2013-05-08 ENCOUNTER — Ambulatory Visit (HOSPITAL_BASED_OUTPATIENT_CLINIC_OR_DEPARTMENT_OTHER): Payer: PRIVATE HEALTH INSURANCE | Admitting: Infectious Diseases

## 2013-05-08 ENCOUNTER — Encounter (HOSPITAL_BASED_OUTPATIENT_CLINIC_OR_DEPARTMENT_OTHER): Payer: Self-pay | Admitting: Infectious Diseases

## 2013-05-08 VITALS — BP 126/83 | HR 75 | Temp 98.5°F | Wt 162.0 lb

## 2013-05-08 DIAGNOSIS — B1920 Unspecified viral hepatitis C without hepatic coma: Secondary | ICD-10-CM

## 2013-05-08 LAB — URINE PREGNANCY TEST (POINT OF CARE): HCG QUALITATIVE URINE: NEGATIVE

## 2013-05-08 LAB — HEMATOCRIT: HEMATOCRIT: 34.6 % (ref 34.1–44.9)

## 2013-05-08 NOTE — Progress Notes (Signed)
[facility]  ID:  Sara Blair is a 27 year old woman who returns for f/u on HCV treatment  PCP: STAHL,BONNI MD    HISTORY OF PRESENT ILLNESS  Date of diagnosis: 2007  Risk factor:  IVDU  Estimated date of acquisition: First needle use 2006  HCV Genotype: 3 (history of 1a)  HCV Fibrosis Stage: FIB4 < F2 using 5/14 laboratory values  Imaging: [place] 5/14 without focal hepatic lesion but with altered waveform in portal vein -> 6 month f/u recommended  HCV Treatment History: no  HCV viral load: 5.2 million in 5/14   HIV status: negative 6/13  HAV status: vaccine #1 6/14  HBV status: immune 6/13  HCV Knowledge:"It's permanent, it's blood-borne, and if I don't keep myself healthy it can become very bad."  Alcohol use: None     Initiated sofosbuvir and ribavirin on July 20th.  A 24 week course is planned (through Jul 28, 2013).    02/20/13:  Hct declined to 32.6 and urine HCG negative  02/21/13:  RBV dose reduced to 400 mg BID  03/2413:   HCV RNA undetectable and hct stable 35.7  She has been using 2 forms of contraception - OCPs and condoms -- but accidentally through away ocp's several days ago and can't pick up new pack for several days so will miss total of 6-7 days.       Today she reports feeling generally well except for difficulty sleeping. She denies abdominal pain, nausea, dyspnea, light-headedness, palpitations.          MEDICATIONS  Wellbutrin  OCP  MVI  Topical benzoyl peroxide 5% bid  Topical clindamycin bid  Sofosbuvir   Ribavirin 400 mg BID, increased to 600 every AM and 400 at noon today 05/08/2013      PAST MEDICAL HISTORY  GERD  Tobacco Use Disorder  Acne  Appendectomy    MENTAL HEALTH HISTORY  Polysubstance Use, drugs of choice were heroin and cocaine, stopped in February, 2014 with assistance of suboxone which she purchased on street for several months prior to stopping. Not in a formal drug recovery program and denies any cravings      REPRODUCTIVE STATUS  Contraception: OCP  and condoms but threw away OCP's by accident several days ago and will miss 6 days this month  Sexually active with BF who is HCV negative.  They practice anal and vaginal intercourse      Social History  Employment: Housekeeper  Supports: Moved to [place] with BF  Good relationship with mother  Started smoking again 2 weeks ago when moved in with a smoker. Smoking 2-3 cigarettes per day.  Interested in stopping again "to avoid wrinkles"        PHYSICAL EXAM  BP 126/83   Pulse 75   Temp(Src) 98.5 F (36.9 C) (Temporal)   Wt 162 lb (73.483 kg)   BMI 26.96 kg/m2   SpO2 98%   LMP 04/17/2013  Well appearing      ASSESSMENT and COUNSELING    Sara Blair is a 27 year old female with chronic HCV infection. She has likely been infected for less than ten years.  Her history suggests she was initially infected with genotype 1a which she spontaneously cleared and then was re-infected with genotype 3. FIB-4 score does not suggest advanced fibrosis.     She initiated sofosbuvir/ribavirin on July 20/14 and has achieved an undetectable HCV RNA on rx (confirmed 04/17/13). Her RBV was dose reduced from 600 mg BID  to 400 mg BID on 02/21/13 d/t decrease in hct.  She is tolerating treatment welll except for insomnia and HER hct has normalized on reduced dose RBV.  Importantly, she has had recent lapse in OCP use        Recommendations  Continue 24 week course of SOF/RBV  (to complete therapy 07/28/12)  Suggested she increase RBV to total of 1000 mg per day today which should be taken as 600 mg in AM and 400 mg in AM  Counseled that RBV is likely contributing to insomnia which may improve if she does not take any RBV after noon.  Stressed importance of not becoming pregnant before during or for 6 months after treatment with ribavirin and stressed importance of consistent condom use given lapse in OCP  Hct today  Urine pregnancy test today  Due for repeat liver US in 11/14 -- will discuss at next visit  Due for second HAV vaccine in  1/15          I have spent 15 minutes in face to face time with this patient/patient proxy of which > 50% was in counseling or coordination of care regarding above issues/Dx.

## 2013-05-16 ENCOUNTER — Ambulatory Visit (HOSPITAL_BASED_OUTPATIENT_CLINIC_OR_DEPARTMENT_OTHER): Payer: PRIVATE HEALTH INSURANCE | Admitting: Infectious Diseases

## 2013-05-29 ENCOUNTER — Ambulatory Visit (HOSPITAL_BASED_OUTPATIENT_CLINIC_OR_DEPARTMENT_OTHER): Payer: PRIVATE HEALTH INSURANCE | Admitting: Infectious Diseases

## 2013-05-30 ENCOUNTER — Telehealth (HOSPITAL_BASED_OUTPATIENT_CLINIC_OR_DEPARTMENT_OTHER): Payer: Self-pay

## 2013-05-30 NOTE — Progress Notes (Addendum)
Needs TB test for work, no history of a positive ppd-can only come in afternoon  To see RN tomorrow for PPD plant around 3pm  To see RN for PPD read 3 pm on Monday, if this RN is in with visit, Britta Mccreedy will see.  Jeannie at the desk to schedule and open slots for above

## 2013-05-30 NOTE — Telephone Encounter (Signed)
Message copied by Sharlyne Pacas on Thu May 30, 2013  8:46 AM  ------       Message from: Atha Starks       Created: Wed May 29, 2013  3:46 PM       Regarding: FW: TB test       Contact: [phone]                       ----- Message -----          From: Riccardo Dubin          Sent: 05/29/2013   3:43 PM            To: Herma Carson Rn'S       Subject: TB test                                                    Rogina Schiano is a 27 year old old female.              Patient's PCP: STAHL,BONNI MD              In case we get disconnected what is the best number to reach you at today:       Home Phone [phone] (home)              Person calling:       Patient (self)              How can I help you today:        Patient needs appointment for TB test but wants to come in Monday morning, Told her I would send a message to the nurse to open the schedule.              Patient's language of care: English              Would you like an interpreter when the nurse calls you back?       NO         ------

## 2013-05-31 ENCOUNTER — Ambulatory Visit (HOSPITAL_BASED_OUTPATIENT_CLINIC_OR_DEPARTMENT_OTHER): Payer: PRIVATE HEALTH INSURANCE

## 2013-05-31 DIAGNOSIS — Z111 Encounter for screening for respiratory tuberculosis: Secondary | ICD-10-CM

## 2013-05-31 MED ORDER — TUBERCULIN PPD 5 UNIT/0.1ML ID SOLN
0.1000 mL | Freq: Once | INTRADERMAL | Status: AC
Start: 2013-05-31 — End: ?

## 2013-05-31 NOTE — Progress Notes (Signed)
S:  Sara Blair is here for a PPD plant.  1)  Has your arm ever turned red, bumpy, swollen or sore after receiving a PPD test for TB? No  2)  Have you ever had the disease Tuberculosis? No  3)  Have you traveled outside the country over the past 2 years? No  4)  Are you taking steroid medication? No  5)  Has there been any cancer or illnesses resulting in a decreased immune system recently? No  6)  Have you ever received BCG? No  6)  ROS of TB symptoms: None      O:  PPD planted.  0.1cc Mantox ID, right forearm.    A:  PPD plant, screen TB.    P:  Pt advised to return in 48-72 hours for appointment to interpret PPD results.       Scheduled in a nursing visit for follow up and documentation of completion.                 As requested patient given letter stating date of last PE 01/06/2012 for employer.

## 2013-06-03 ENCOUNTER — Ambulatory Visit (HOSPITAL_BASED_OUTPATIENT_CLINIC_OR_DEPARTMENT_OTHER): Payer: PRIVATE HEALTH INSURANCE

## 2013-06-03 DIAGNOSIS — Z111 Encounter for screening for respiratory tuberculosis: Secondary | ICD-10-CM

## 2013-06-03 LAB — SKIN TEST TUBERCULOSIS INTRADERMAL: PPD: 0 mm

## 2013-06-03 NOTE — Progress Notes (Signed)
Sara Blair is here for a PPD read.  PPD planted right forearm on 05/31/13.  No erthyema, 0 mm induration right forearm. Negative PPD read.  F/u as scheduled with provider.    Letter given stating negative PPD result

## 2013-06-26 ENCOUNTER — Ambulatory Visit (HOSPITAL_BASED_OUTPATIENT_CLINIC_OR_DEPARTMENT_OTHER): Payer: PRIVATE HEALTH INSURANCE | Admitting: Infectious Diseases

## 2013-06-26 ENCOUNTER — Telehealth (HOSPITAL_BASED_OUTPATIENT_CLINIC_OR_DEPARTMENT_OTHER): Payer: Self-pay | Admitting: Infectious Diseases

## 2013-06-26 ENCOUNTER — Encounter (HOSPITAL_BASED_OUTPATIENT_CLINIC_OR_DEPARTMENT_OTHER): Payer: Self-pay | Admitting: Infectious Diseases

## 2013-06-26 VITALS — BP 133/71 | HR 99 | Temp 98.6°F | Wt 155.0 lb

## 2013-06-26 DIAGNOSIS — B1920 Unspecified viral hepatitis C without hepatic coma: Secondary | ICD-10-CM

## 2013-06-26 LAB — HEMATOCRIT: HEMATOCRIT: 33 % — ABNORMAL LOW (ref 34.1–44.9)

## 2013-06-26 LAB — URINE PREGNANCY TEST (POINT OF CARE): HCG QUALITATIVE URINE: NEGATIVE

## 2013-06-26 NOTE — Progress Notes (Signed)
Informed patient that hct not far below baseline despite high dose ribavirin rx.  Gave her option of decreasing ribavirin to address nausea but she feels she would like to stay on full dose.  I encouraged her to contact me if sxs worsen.

## 2013-06-26 NOTE — Progress Notes (Signed)
[facility]  ID:  Sara Blair is a 27 year old woman who returns for f/u on HCV treatment  PCP: STAHL,BONNI MD    HISTORY OF PRESENT ILLNESS  Date of diagnosis: 2007  Risk factor:  IVDU  Estimated date of acquisition: First needle use 2006  HCV Genotype: 3 (history of 1a)  HCV Fibrosis Stage: FIB4 < F2 using 5/14 laboratory values  Imaging: [place] 5/14 without focal hepatic lesion but with altered waveform in portal vein -> 6 month f/u recommended  HCV Treatment History: no  HCV viral load: 5.2 million in 5/14   HIV status: negative 6/13  HAV status: vaccine #1 6/14  HBV status: immune 6/13  HCV Knowledge:"It's permanent, it's blood-borne, and if I don't keep myself healthy it can become very bad."  Alcohol use: None     Initiated sofosbuvir and ribavirin on July 20th.  A 24 week course is planned (through Jul 28, 2013).    02/20/13:  Hct declined to 32.6 and urine HCG negative  02/21/13:  RBV dose reduced to 400 mg BID  03/2413:   HCV RNA undetectable and hct stable 35.7  05/08/13 Increased RBV to 1000 mg daily.   06/26/2013 Today patient reports that she actually increased her ribavirin to 1200 mg daily and has been feeling dizzy when she wakes and nauseated all the time.       She has been using 2 forms of contraception - OCPs and condoms        MEDICATIONS  Wellbutrin  OCP  MVI  Topical benzoyl peroxide 5% bid  Topical clindamycin bid  Sofosbuvir   Ribavirin 400 mg BID, increased to 600 every AM and 400 at noon today 05/08/2013      PAST MEDICAL HISTORY  GERD  Tobacco Use Disorder  Acne  Appendectomy    MENTAL HEALTH HISTORY  Polysubstance Use, drugs of choice were heroin and cocaine, stopped in February, 2014 with assistance of suboxone which she purchased on street for several months prior to stopping. Not in a formal drug recovery program and denies any cravings      REPRODUCTIVE STATUS  Contraception: OCP and condoms but threw away OCP's by accident several days ago and  will miss 6 days this month  Sexually active with BF who is HCV negative.  They practice anal and vaginal intercourse      Social History  Employment: Housekeeper  Supports: Moved to [place] with BF  Good relationship with mother  Started smoking again 2 weeks ago when moved in with a smoker. Smoking 2-3 cigarettes per day.  Interested in stopping again "to avoid wrinkles"        PHYSICAL EXAM  BP 126/83   Pulse 75   Temp(Src) 98.5 F (36.9 C) (Temporal)   Wt 162 lb (73.483 kg)   BMI 26.96 kg/m2   SpO2 98%   LMP 04/17/2013  Well appearing      ASSESSMENT and COUNSELING    Sara Blair is a 27 year old female with chronic HCV infection. She has likely been infected for less than ten years.  Her history suggests she was initially infected with genotype 1a which she spontaneously cleared and then was re-infected with genotype 3. FIB-4 score does not suggest advanced fibrosis.     She initiated sofosbuvir/ribavirin on July 20/14 and has achieved an undetectable HCV RNA on rx (confirmed 04/17/13). Her RBV was dose reduced from 600 mg BID to 400 mg BID on 02/21/13 d/t decrease in  hct then increased back to 1000 mg daily in 10/14 though she has actually been taking 1200 mg per day.  Today she reports AM dizziness and nausea which I suspect is related to anemia.     Recommendations  Continue 24 week course of SOF/RBV  (to complete therapy 07/28/12)  Stressed importance of not becoming pregnant before during or for 6 months after treatment with ribavirin and stressed importance of consistent condom use given lapse in OCP  Hct today- I will contact her by telephone today with result and recommendation for RBV dose adjustment.   Urine pregnancy test today  Liver US  Laboratory visit for 12-week post therapy HCV RNA in late March, 2015  Discussed likely approval of salvage rx within next several weeks should she have virologic relapse after completion of rx  F/U with me  Mid-April, 2015  HAV vaccine at next visit.             I  have spent 15 minutes in face to face time with this patient/patient proxy of which > 50% was in counseling or coordination of care regarding above issues/Dx.

## 2013-07-16 ENCOUNTER — Ambulatory Visit (HOSPITAL_BASED_OUTPATIENT_CLINIC_OR_DEPARTMENT_OTHER): Payer: Self-pay | Admitting: Infectious Diseases

## 2013-07-16 DIAGNOSIS — B1920 Unspecified viral hepatitis C without hepatic coma: Secondary | ICD-10-CM

## 2013-07-26 ENCOUNTER — Telehealth (HOSPITAL_BASED_OUTPATIENT_CLINIC_OR_DEPARTMENT_OTHER): Payer: Self-pay

## 2013-07-26 LAB — US ABDOMEN PELVIS SCROTUM & OR RETROPERITONEUM DOPPLER LIMITED

## 2013-07-26 LAB — US ABDOMEN COMPLETE

## 2013-07-26 NOTE — Addendum Note (Signed)
Addended by: Renold DonASILVA, MARIA on: 07/26/2013 08:43 AM     Modules accepted: Orders

## 2013-07-26 NOTE — Progress Notes (Signed)
Patient was told to call clinic for results of her ultrasound.  Patient informed that Dr. Damita Dunningsolson will be in the clinic on Monday.  Information of her inquiry will be forwarded to Dr. Damita Dunningsolson.  Patient is willing to wait for results.

## 2013-07-26 NOTE — Telephone Encounter (Signed)
Message copied by Francisca DecemberLASHLEY, Arielys Wandersee on Fri Jul 26, 2013  5:58 PM  ------       Message from: Ellerbe[place]ERGEAU, [place]       Created: Fri Jul 26, 2013  2:46 PM                       Sara Blair [id number], 28 year old, female, Telephone Information:       Home Phone      [phone]       Work Phone      Not on file.       Mobile          [phone]                     Patient's Preferred Pharmacy:               [id number] - [place], [place] - [address] AT [facility] [address]       Phone: [phone] Fax: [phone]              [id number] - [place], [place] - [address] AT [facility] [address]       Phone: [phone] Fax: [phone]                     CONFIRMED TODAY: Yes              CALL BACK NUMBER: [phone]       Best time to call back:        Cell phone:        Other phone:              Available times:              Patient's language of care: English              Patient does not need an interpreter.              Patient's PCP: Alton RevereSTAHL,BONNI MD              Person calling on behalf of patient: Patient (self)              Calls today with questions and concerns test result                       ------

## 2013-07-29 ENCOUNTER — Telehealth (HOSPITAL_BASED_OUTPATIENT_CLINIC_OR_DEPARTMENT_OTHER): Payer: Self-pay | Admitting: Infectious Diseases

## 2013-07-29 NOTE — Progress Notes (Signed)
Informed patient of normal abdominal US.  Confirmed that she will have HCV RNA drawn late March.

## 2013-10-21 ENCOUNTER — Ambulatory Visit (HOSPITAL_BASED_OUTPATIENT_CLINIC_OR_DEPARTMENT_OTHER): Payer: PRIVATE HEALTH INSURANCE

## 2013-10-21 DIAGNOSIS — B1920 Unspecified viral hepatitis C without hepatic coma: Secondary | ICD-10-CM

## 2013-10-21 NOTE — Progress Notes (Signed)
Labs drawn

## 2013-10-24 LAB — HEPATITIS C PCR QUANT: HEPATITIS C PCR IU/ML: 0 IU/ml

## 2013-10-31 ENCOUNTER — Encounter (HOSPITAL_BASED_OUTPATIENT_CLINIC_OR_DEPARTMENT_OTHER): Payer: Self-pay | Admitting: Infectious Diseases

## 2013-11-04 ENCOUNTER — Other Ambulatory Visit (HOSPITAL_BASED_OUTPATIENT_CLINIC_OR_DEPARTMENT_OTHER): Payer: Self-pay | Admitting: Internal Medicine

## 2013-11-04 DIAGNOSIS — Z309 Encounter for contraceptive management, unspecified: Secondary | ICD-10-CM

## 2013-11-04 MED ORDER — NORGESTIM-ETH ESTRAD TRIPHASIC 0.18/0.215/0.25 MG-25 MCG PO TABS
1.0000 | ORAL_TABLET | Freq: Every day | ORAL | Status: DC
Start: 2013-11-04 — End: 2014-02-28

## 2013-11-06 ENCOUNTER — Encounter (HOSPITAL_BASED_OUTPATIENT_CLINIC_OR_DEPARTMENT_OTHER): Payer: Self-pay | Admitting: Infectious Diseases

## 2013-11-06 ENCOUNTER — Ambulatory Visit (HOSPITAL_BASED_OUTPATIENT_CLINIC_OR_DEPARTMENT_OTHER): Payer: PRIVATE HEALTH INSURANCE | Admitting: Infectious Diseases

## 2013-11-06 VITALS — BP 134/83 | HR 80 | Temp 98.2°F | Wt 159.0 lb

## 2013-11-06 DIAGNOSIS — B1920 Unspecified viral hepatitis C without hepatic coma: Secondary | ICD-10-CM

## 2013-11-06 NOTE — Progress Notes (Signed)
[facility]  ID:  Sara Blair is a 28 year old woman who returns for f/u on HCV treatment  PCP: STAHL,BONNI MD    HISTORY OF PRESENT ILLNESS  Date of diagnosis: 2007  Risk factor:  IVDU  Estimated date of acquisition: First needle use 2006  HCV Genotype: 3 (history of 1a)  HCV Fibrosis Stage: FIB4 < F2 using 5/14 laboratory values  Imaging: US 5/14 without focal hepatic lesion but with altered waveform in portal vein ; repeat US 1/15 normal  Pre-treatment HCV viral load: 5.2 million in 5/14   HIV status: negative 6/13  HAV status: vaccine #1 01/02/13  HBV status: immune 6/13  Alcohol use: None  Started SOF/RBV July 20/14  02/20/13:  Hct 32.6 and urine HCG negative  02/21/13:  RBV dose reduced to 400 mg BID  04/18/13:  HCV RNA undetectable and hct stable 35.7.     05/08/13 Increased RBV to 1000 mg daily but patient has been taking 1200 per day.  06/26/2013 hct stable  07/28/13: completed rx  10/21/13:  HCV RNA undetectable          MEDICATIONS  Wellbutrin  OCP  MVI  Topical benzoyl peroxide 5% bid  Topical clindamycin bid          PAST MEDICAL HISTORY  GERD  Tobacco Use Disorder  Acne  Appendectomy    MENTAL HEALTH HISTORY  Polysubstance Use, drugs of choice were heroin and cocaine, stopped in February, 2014 with assistance of suboxone which she purchased on street for several months prior to stopping. Not in a formal drug recovery program and denies any cravings      REPRODUCTIVE STATUS  Contraception: OCP and condoms but threw away OCP's by accident several days ago and will miss 6 days this month  Sexually active with BF who is HCV negative.  They practice anal and vaginal intercourse      Social History  Employment: Housekeeper  Supports: [place]  Good relationship with mother  Started smoking again 2 weeks ago when moved in with a smoker. Smoking 2-3 cigarettes per day.  Interested in stopping again "to avoid wrinkles"        PHYSICAL  EXAM  BP 126/83   Pulse 75   Temp(Src) 98.5 F (36.9 C) (Temporal)   Wt 162 lb (73.483 kg)   BMI 26.96 kg/m2   SpO2 98%   LMP 04/17/2013  Well appearing      ASSESSMENT and COUNSELING    Sara Blair is a 28 year old female with history of gentoype 3 HCV who completed rx with sofosbuvir/ribavirin in early January and had undetectable HCV RNA level 12 weeks later. I informed her today that this result suggests 1% chance that her HCV will relapse and 99% chance that her HCV is cured.  She was thrilled to hear this news.   We again reviewed measures to maintain liver health and agreed that she would have a repeat HCV RNA level measured at this clinic in the fall to confirm HCV cure.  I will call her with results of that test.  I neglected to order her last HAV vaccine which should be done at next visit to PCP.             I have spent 10 minutes in face to face time with this patient/patient proxy of which > 50% was in counseling or coordination of care regarding above issues/Dx.

## 2013-12-13 ENCOUNTER — Other Ambulatory Visit (HOSPITAL_BASED_OUTPATIENT_CLINIC_OR_DEPARTMENT_OTHER): Payer: Self-pay | Admitting: Internal Medicine

## 2013-12-13 NOTE — Progress Notes (Signed)
PER Pharmacy, Sara Blair is a 28 year old female has requested a refill of benzoyl peroxide 5% external wash.      Last Office Visit: 11-06-13  Last Physical Exam: n/a      Other Med Adult:  Most Recent BP Reading(s)  11/06/13 : 134/83        Cholesterol (mg/dl)   Date  Value    9/60/4540  201*   ----------    LDL DIRECT (mg/dl)   Date  Value    9/81/1914  124*   ----------    HDL (mg/dl)   Date  Value    7/82/9562  52    ----------    TRIGLYCERIDES (mg/dl)   Date  Value    08/24/8655  80    ----------        No results found for this basename: TSHSC        No results found for this basename: TSH      No results found for this basename: hgba1c        INR (no units)   Date  Value    12/05/2012  < 1.0*   ----------       Documented patient preferred pharmacies:    [id number] - [place], [place] - [address]  Phone: [phone] Fax: [phone]

## 2014-02-16 ENCOUNTER — Other Ambulatory Visit (HOSPITAL_BASED_OUTPATIENT_CLINIC_OR_DEPARTMENT_OTHER): Payer: Self-pay | Admitting: Internal Medicine

## 2014-02-16 NOTE — Progress Notes (Signed)
Person calling on behalf of patient: Pharmacy    Sara Blair is a 28 year old female       - medication(s) request: Bupropion SR 100 mg  - last office visit: 11/06/2013  - last physical exam: n/a      Other Med Adult:  Most Recent BP Reading(s)  11/06/13 : 134/83        Cholesterol (mg/dl)   Date  Value    1/61/09606/14/2013  201*   ----------    LOW DENSITY LIPOPROTEIN DIRECT (mg/dl)   Date  Value    4/54/09816/14/2013  124*   ----------    HIGH DENSITY LIPOPROTEIN (mg/dl)   Date  Value    1/91/47826/14/2013  52    ----------    TRIGLYCERIDES (mg/dl)   Date  Value    9/56/21306/14/2013  80    ----------        No results found for this basename: TSHSC        No results found for this basename: TSH      No results found for this basename: hgba1c        INR (no units)   Date  Value    12/05/2012  < 1.0*   ----------           Documented patient preferred pharmacies:    [facility] [id number] - [place], [place] - [address] [facility] [address]  Phone: [phone] Fax: [phone]    [facility] [id number] - [place], [place] - [address] AT [facility] [address]  Phone: [phone] Fax: [phone]    [facility] [id number] - [place] - [address] [facility]  Phone: [phone] Fax: [phone]    [facility] [id number] - [place], [place] - [address] [facility] [address]  Phone: [phone] Fax: [phone]

## 2014-02-27 ENCOUNTER — Encounter (HOSPITAL_BASED_OUTPATIENT_CLINIC_OR_DEPARTMENT_OTHER): Payer: Self-pay | Admitting: Internal Medicine

## 2014-02-27 ENCOUNTER — Ambulatory Visit (HOSPITAL_BASED_OUTPATIENT_CLINIC_OR_DEPARTMENT_OTHER): Payer: PRIVATE HEALTH INSURANCE | Admitting: Internal Medicine

## 2014-02-27 VITALS — BP 124/70 | HR 96 | Temp 97.7°F | Wt 160.0 lb

## 2014-02-27 DIAGNOSIS — Z23 Encounter for immunization: Secondary | ICD-10-CM

## 2014-02-27 DIAGNOSIS — R5381 Other malaise: Secondary | ICD-10-CM

## 2014-02-27 DIAGNOSIS — F1910 Other psychoactive substance abuse, uncomplicated: Secondary | ICD-10-CM

## 2014-02-27 DIAGNOSIS — R5383 Other fatigue: Secondary | ICD-10-CM

## 2014-02-27 DIAGNOSIS — B1920 Unspecified viral hepatitis C without hepatic coma: Secondary | ICD-10-CM

## 2014-02-27 LAB — CBC WITH PLATELET
HEMATOCRIT: 40.9 % (ref 34.1–44.9)
HEMOGLOBIN: 13.9 g/dL (ref 11.2–15.7)
MEAN CORP HGB CONC: 34 g/dL (ref 31.0–37.0)
MEAN CORPUSCULAR HGB: 30.6 pg (ref 26.0–34.0)
MEAN CORPUSCULAR VOL: 90.1 fL (ref 80.0–100.0)
MEAN PLATELET VOLUME: 11 fL (ref 8.7–12.5)
PLATELET COUNT: 201 10*3/uL (ref 150–400)
RBC DISTRIBUTION WIDTH STD DEV: 39.4 fL (ref 35.1–46.3)
RBC DISTRIBUTION WIDTH: 12.2 % (ref 11.5–14.3)
RED BLOOD CELL COUNT: 4.54 M/uL (ref 3.90–5.20)
WHITE BLOOD CELL COUNT: 9.8 10*3/uL (ref 4.0–11.0)

## 2014-02-27 MED ORDER — TUBERCULIN PPD 5 UNIT/0.1ML ID SOLN
0.10 mL | Freq: Once | INTRADERMAL | Status: AC
Start: 2014-02-27 — End: ?

## 2014-02-27 MED ORDER — BUPROPION HCL ER (SR) 150 MG PO TB12
150.0000 mg | ORAL_TABLET | Freq: Two times a day (BID) | ORAL | Status: DC
Start: 2014-02-27 — End: 2014-02-28

## 2014-02-27 NOTE — Progress Notes (Signed)
Sara Blair is a 28 year old female who presents with the following problems:    Will be moving to [place] in about a month. Will be following her fiance for work.     This Tuesday had severe headache. Under a lot of stress and feels that this is related. Relieved with tylenol and an ice pack.     Is not consistently using the trenitoin given difficulties in applying. Inconsistent with with doxycyline. Concerned re interaction with OCP.    Patient notes that she has been taking 3 of her wellbutrin during the day intermittantly. To help with her fatigue. Will go home and nap if she does not. Fatigue, started with the HCV meds and has just continued. Notes that mood feels blah. Not tearful, sleep good.       ROS:  Constitutional: no weight loss, fevers  MSK: no joint pains or swelling  Psych: as above, denies SI.  Neuro: no focal deficits      Past medical history/Past surgical History  Noted in Epic    Social History Narrative  Noted in Epic    Family History Narrative  Noted in Epic      Review of Patient's Allergies indicates:   Penicillins             Rash        Current Outpatient Prescriptions on File Prior to Visit:  clotrimazole-betamethasone (LOTRISONE) lotion Apply  topically 2 (two) times daily. Disp: 30 mL Rfl: 0     Current Facility-Administered Medications on File Prior to Visit:  tuberculin (PPD) PLANT 0.1 mL Intradermal Once Dineen Tennihan       OBJECTIVE:  BP 124/70   Pulse 96   Temp(Src) 97.7 F (36.5 C) (Oral)   Wt 72.576 kg (160 lb)   SpO2 98%   LMP 02/19/2014  General: seated comfortable in NAD, alert, pleasant and cooperative  Eyes: Sclera anicteric.  HEENT: Mucous membranes moist.   Cardiac: Heart regular rate rhythm. Normal S1 S2. Without murmur gallop or rub. No clubbing cyanosis or edema of extremities. DP and PT pulses +2.   Pulm: Patient speaking in full sentences. Lungs clear to auscultation.   Abd: Abdomen soft, non tender, non distended. No masses. No hepatosplenomegaly  noted.          A/P:  (070.70) Unspecified viral hepatitis C without hepatic coma  (primary encounter diagnosis)  Comment: will retest viral load now as patient is unsure reguarding her insurance status over the next year. WOuld stay behind for additional consultation if positive Hep C.   Plan: HEALTH CARE PROXY, HEPATIC FUNCTION PANEL,         HEPATITIS C PCR QUAL TO QUANT, COLLECTION         VENOUS BLOOD VENIPUNCTURE, HEPATITIS C PCR         QUANTITATIVE    (780.79) Malaise and fatigue  Comment: will send some additional testing. I suspect mood may be a big player here. Discussed how to take wellbutrin and dosage.   Plan: CBC WITH PLATELET, THYROID SCREEN TSH REFLEX         FT4, VITAMIN B12, COLLECTION VENOUS BLOOD         VENIPUNCTURE    (305.90) Polysubstance abuse  Comment: stable.     (V06.1) Need for Tdap vaccination  Plan: tuberculin (PPD) PLANT, tuberculin (PPD) READ,         TDAP VACCINE 7 AND OLDER IM (PUBLIC),         IMMUNIZATION  ADMIN SINGLE, RN    (V05.3) Need for prophylactic vaccination and inoculation against viral hepatitis  Plan: IMMUNIZATION ADMIN EACH ADD, RN, HEPATITIS A         VACCINE ADULT FOR INTRAMUSCULAR USE          Return to Clinic: moving.

## 2014-02-27 NOTE — Progress Notes (Signed)
Patient to receive TDAP today per MD order.  Confirmed patient's name and date of birth.  Patient denies allergy to this vaccine including DTP, DTap, DT or Td. Patient denies latex allergy. Patient denies epilepsy or Guillain Barre Syndrome.   Patient denies pregnancy.   Patient denies adverse effects from previous administration of this vaccine. Risks and benefits discussed. VIS offered, reviewed and accepted by patient. Questions answered to patient's satisfaction.  Injection tolerated without difficulty. Patient denies adverse effects from injection at this time.   Patient encouraged to utilize arm of injection and not to favor it. Patient encourage to take OTC pain reliever of choice and to apply ice for discomfort if necessary.   Patient is aware to call clinic with any questions or concerns.   Please refer to IMM / INJ section for administration site, lot number, expiration date and manufacturer.    Patient to receive Hepatitis A Vaccine#2 today per order of MD.  Verified patient's name and date of birth.  Patient denies allergy to this vaccine. Patient denies allergy to any vaccine component (such as alum or 2-phenoxyethanol. Denies pregnancy. VIS offered and accepted by patient. Risks and benefits discussed. Questions   answered to patient's satisfaction.  Hepatitis A vaccine (Vaqta) administered to this patient by this nurse.   Patient tolerated injection without difficulty. Patient denies adverse reaction at this time. Advised to wait 20 minutes in the clinic to be sure of no reaction.  Patient encourage to utilize arm of injection and not to favor it. Patient encouraged to take OTC pain reliever of choice and apply ice for discomfort as necessary.   Patient is aware to call clinic with questions or concerns.  Please refer to IMM / INJ section for administration site, manufacturer, lot number and expiration date.      Cammie McgeeJana Brook Mall, LPN

## 2014-02-28 ENCOUNTER — Telehealth (HOSPITAL_BASED_OUTPATIENT_CLINIC_OR_DEPARTMENT_OTHER): Payer: Self-pay

## 2014-02-28 DIAGNOSIS — Z309 Encounter for contraceptive management, unspecified: Secondary | ICD-10-CM

## 2014-02-28 MED ORDER — NORGESTIM-ETH ESTRAD TRIPHASIC 0.18/0.215/0.25 MG-25 MCG PO TABS
1.0000 | ORAL_TABLET | Freq: Every day | ORAL | Status: DC
Start: 2014-02-28 — End: 2015-03-04

## 2014-02-28 MED ORDER — BUPROPION HCL ER (SR) 150 MG PO TB12
150.0000 mg | ORAL_TABLET | Freq: Two times a day (BID) | ORAL | Status: AC
Start: 2014-02-28 — End: 2014-05-31

## 2014-02-28 NOTE — Progress Notes (Signed)
To provider to review .

## 2014-02-28 NOTE — Telephone Encounter (Signed)
-----   Message from Otilio SaberNorma Montano sent at 02/28/2014  9:47 AM EDT -----  Regarding: RE: medication directions   Contact: [phone]  Sara Harvestracie Blair is a 28 year old old female.    Patient's PCP: STAHL,BONNI MD    In case we get disconnected what is the best number to reach you at today:  Other Phone [phone]    Person calling:  Rosanne AshingJim, Pharmacist     How can I help you today:   Other bupropion directions says to take one tab two times a day, but quantity is for 30 tabs.       [facility] [id number] - [place], [place] - [address] Phone: [phone] Fax: [phone]      Patient's language of care: English    Would you like an interpreter when the nurse calls you back?  NO

## 2014-02-28 NOTE — Progress Notes (Signed)
.  spoke with NH to see if patient could get 90 day [place]

## 2014-03-01 ENCOUNTER — Ambulatory Visit (HOSPITAL_BASED_OUTPATIENT_CLINIC_OR_DEPARTMENT_OTHER): Payer: PRIVATE HEALTH INSURANCE | Admitting: Internal Medicine

## 2014-03-04 ENCOUNTER — Telehealth (HOSPITAL_BASED_OUTPATIENT_CLINIC_OR_DEPARTMENT_OTHER): Payer: Self-pay
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
## 2014-03-04 NOTE — Progress Notes (Signed)
Call to patient, LM to call clinic and speak with RN or proceed to nearest ER prn

## 2014-03-05 LAB — VITAMIN B12: VITAMIN B12: 402 pg/mL (ref 193–986)

## 2014-03-05 LAB — HEPATIC FUNCTION PANEL
ALANINE AMINOTRANSFERASE: 20 U/L (ref 12–45)
ALBUMIN: 4.1 g/dL (ref 3.4–5.0)
ALKALINE PHOSPHATASE: 67 U/L (ref 45–117)
ASPARTATE AMINOTRANSFERASE: 18 U/L (ref 8–34)
BILIRUBIN DIRECT: 0.1 mg/dl (ref 0.0–0.2)
BILIRUBIN TOTAL: 0.4 mg/dL (ref 0.2–1.0)
INDIRECT BILIRUBIN: 0.3 mg/dL (ref 0.2–0.9)
TOTAL PROTEIN: 7.5 g/dL (ref 6.4–8.2)

## 2014-03-05 LAB — HEPATITIS C PCR QUANT: HEPATITIS C PCR IU/ML: 0 IU/ml

## 2014-03-05 LAB — HEPATITIS C PCR QUAL TO QUANT: HEPATITIS C ANTIBODY: REACTIVE — AB

## 2014-03-05 LAB — THYROID SCREEN TSH REFLEX FT4: THYROID SCREEN TSH REFLEX FT4: 1.99 u[IU]/mL (ref 0.358–3.740)

## 2014-03-14 ENCOUNTER — Telehealth (HOSPITAL_BASED_OUTPATIENT_CLINIC_OR_DEPARTMENT_OTHER): Payer: Self-pay | Admitting: Ambulatory Care

## 2014-03-14 NOTE — Progress Notes (Signed)
Will forward to Dr.Stahl to review labs   HEPATITIS C ANTIBODY (no units)   Date  Value    02/27/2014  REACTIVE*   ----------

## 2014-03-14 NOTE — Telephone Encounter (Signed)
-----   Message from Riccardo DubinMarcia Rocha sent at 03/14/2014  9:24 AM EDT -----  Regarding: blood test results  Contact: [phone]  Sara Blair is a 28 year old old female.    Patient's PCP: STAHL,BONNI MD    In case we get disconnected what is the best number to reach you at today:  Home Phone [phone] (home)    Person calling:  Patient (self)    How can I help you today:   Patient is calling for blood test results    Patient's language of care: English    Would you like an interpreter when the nurse calls you back?  NO

## 2014-03-14 NOTE — Progress Notes (Signed)
Called pt with results. Was thrilled with the news that her hep C PCR was zero. Also advised CBC all wnl

## 2014-03-14 NOTE — Progress Notes (Signed)
This is actually good news the patients hep C viral load is persistently negative showing that she has cleared the virus.   She will always have a positive antibody.

## 2014-04-28 ENCOUNTER — Ambulatory Visit (HOSPITAL_BASED_OUTPATIENT_CLINIC_OR_DEPARTMENT_OTHER): Payer: PRIVATE HEALTH INSURANCE | Admitting: Infectious Diseases

## 2014-06-13 ENCOUNTER — Encounter (HOSPITAL_COMMUNITY): Payer: Self-pay | Admitting: *Deleted

## 2014-06-13 ENCOUNTER — Emergency Department (HOSPITAL_COMMUNITY)
Admission: EM | Admit: 2014-06-13 | Discharge: 2014-06-13 | Disposition: A | Discharge status: Home / Self Care | Payer: Medicaid - Out of State | Attending: Emergency Medicine | Admitting: Emergency Medicine

## 2014-06-13 DIAGNOSIS — Y9389 Activity, other specified: Secondary | ICD-10-CM | POA: Diagnosis not present

## 2014-06-13 DIAGNOSIS — Z91040 Latex allergy status: Secondary | ICD-10-CM | POA: Insufficient documentation

## 2014-06-13 DIAGNOSIS — Z720 Tobacco use: Secondary | ICD-10-CM | POA: Diagnosis not present

## 2014-06-13 DIAGNOSIS — X19XXXA Contact with other heat and hot substances, initial encounter: Secondary | ICD-10-CM | POA: Insufficient documentation

## 2014-06-13 DIAGNOSIS — Y9289 Other specified places as the place of occurrence of the external cause: Secondary | ICD-10-CM | POA: Diagnosis not present

## 2014-06-13 DIAGNOSIS — Y998 Other external cause status: Secondary | ICD-10-CM | POA: Diagnosis not present

## 2014-06-13 DIAGNOSIS — T23221A Burn of second degree of single right finger (nail) except thumb, initial encounter: Secondary | ICD-10-CM | POA: Diagnosis not present

## 2014-06-13 DIAGNOSIS — T23001A Burn of unspecified degree of right hand, unspecified site, initial encounter: Secondary | ICD-10-CM | POA: Diagnosis present
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
## 2014-07-14 ENCOUNTER — Encounter (HOSPITAL_COMMUNITY): Payer: Self-pay | Admitting: *Deleted

## 2014-07-14 ENCOUNTER — Emergency Department (HOSPITAL_COMMUNITY)
Admission: EM | Admit: 2014-07-14 | Discharge: 2014-07-14 | Disposition: A | Discharge status: Home / Self Care | Payer: Medicaid - Out of State | Attending: Emergency Medicine | Admitting: Emergency Medicine

## 2014-07-14 DIAGNOSIS — R197 Diarrhea, unspecified: Secondary | ICD-10-CM | POA: Insufficient documentation

## 2014-07-14 DIAGNOSIS — R7401 Elevation of levels of liver transaminase levels: Secondary | ICD-10-CM

## 2014-07-14 DIAGNOSIS — Z8719 Personal history of other diseases of the digestive system: Secondary | ICD-10-CM | POA: Insufficient documentation

## 2014-07-14 DIAGNOSIS — Z91040 Latex allergy status: Secondary | ICD-10-CM | POA: Insufficient documentation

## 2014-07-14 DIAGNOSIS — R740 Nonspecific elevation of levels of transaminase and lactic acid dehydrogenase [LDH]: Secondary | ICD-10-CM | POA: Diagnosis not present

## 2014-07-14 DIAGNOSIS — R1084 Generalized abdominal pain: Secondary | ICD-10-CM | POA: Insufficient documentation

## 2014-07-14 DIAGNOSIS — Z79899 Other long term (current) drug therapy: Secondary | ICD-10-CM | POA: Diagnosis not present

## 2014-07-14 DIAGNOSIS — R1013 Epigastric pain: Secondary | ICD-10-CM

## 2014-07-14 DIAGNOSIS — Z9049 Acquired absence of other specified parts of digestive tract: Secondary | ICD-10-CM | POA: Insufficient documentation

## 2014-07-14 DIAGNOSIS — Z3202 Encounter for pregnancy test, result negative: Secondary | ICD-10-CM | POA: Insufficient documentation

## 2014-07-14 DIAGNOSIS — Z880 Allergy status to penicillin: Secondary | ICD-10-CM | POA: Diagnosis not present

## 2014-07-14 DIAGNOSIS — Z720 Tobacco use: Secondary | ICD-10-CM | POA: Insufficient documentation

## 2014-07-14 DIAGNOSIS — Z793 Long term (current) use of hormonal contraceptives: Secondary | ICD-10-CM | POA: Insufficient documentation

## 2014-07-14 HISTORY — DX: Gastro-esophageal reflux disease without esophagitis: K21.9

## 2014-07-14 LAB — COMPREHENSIVE METABOLIC PANEL
ALBUMIN: 3.8 g/dL (ref 3.5–5.2)
ALK PHOS: 56 U/L (ref 39–117)
ALT: 38 U/L — ABNORMAL HIGH (ref 0–35)
AST: 39 U/L — AB (ref 0–37)
Anion gap: 13 (ref 5–15)
BUN: 7 mg/dL (ref 6–23)
CHLORIDE: 101 meq/L (ref 96–112)
CO2: 24 meq/L (ref 19–32)
CREATININE: 0.71 mg/dL (ref 0.50–1.10)
Calcium: 9.6 mg/dL (ref 8.4–10.5)
GFR calc Af Amer: >90 mL/min (ref >90)
Glucose, Bld: 96 mg/dL (ref 70–99)
POTASSIUM: 4.6 meq/L (ref 3.7–5.3)
Sodium: 138 mEq/L (ref 137–147)
Total Protein: 7.2 g/dL (ref 6.0–8.3)

## 2014-07-14 LAB — CBC WITH DIFFERENTIAL/PLATELET
Basophils Absolute: 0 10*3/uL (ref 0.0–0.1)
Basophils Relative: 0 % (ref 0–1)
Eosinophils Absolute: 0.2 10*3/uL (ref 0.0–0.7)
Eosinophils Relative: 2 % (ref 0–5)
HEMATOCRIT: 43.8 % (ref 36.0–46.0)
HEMOGLOBIN: 14.5 g/dL (ref 12.0–15.0)
LYMPHS PCT: 19 % (ref 12–46)
Lymphs Abs: 1.3 10*3/uL (ref 0.7–4.0)
MCH: 30.1 pg (ref 26.0–34.0)
MCHC: 33.1 g/dL (ref 30.0–36.0)
MCV: 91.1 fL (ref 78.0–100.0)
MONOS PCT: 5 % (ref 3–12)
Monocytes Absolute: 0.3 10*3/uL (ref 0.1–1.0)
NEUTROS ABS: 5 10*3/uL (ref 1.7–7.7)
NEUTROS PCT: 74 % (ref 43–77)
Platelets: 283 10*3/uL (ref 150–400)
RBC: 4.81 MIL/uL (ref 3.87–5.11)
RDW: 12.6 % (ref 11.5–15.5)
WBC: 6.9 10*3/uL (ref 4.0–10.5)

## 2014-07-14 LAB — URINALYSIS, ROUTINE W REFLEX MICROSCOPIC
BILIRUBIN URINE: NEGATIVE
Glucose, UA: NEGATIVE mg/dL
HGB URINE DIPSTICK: NEGATIVE
KETONES UR: NEGATIVE mg/dL
Leukocytes, UA: NEGATIVE
NITRITE: NEGATIVE
Protein, ur: NEGATIVE mg/dL
SPECIFIC GRAVITY, URINE: 1.012 (ref 1.005–1.030)
Urobilinogen, UA: 0.2 mg/dL (ref 0.0–1.0)
pH: 6 (ref 5.0–8.0)

## 2014-07-14 LAB — I-STAT BETA HCG BLOOD, ED (MC, WL, AP ONLY)

## 2014-07-14 LAB — POC URINE PREG, ED: Preg Test, Ur: NEGATIVE

## 2014-07-14 LAB — LIPASE, BLOOD: LIPASE: 30 U/L (ref 11–59)

## 2014-07-14 MED ORDER — ONDANSETRON 4 MG PO TBDP
4.0000 mg | ORAL_TABLET | Freq: Once | ORAL | Status: AC
  Administered 2014-07-14: 4 mg via ORAL
  Filled 2014-07-14: qty 1

## 2014-07-14 MED ORDER — FAMOTIDINE 20 MG PO TABS
20.0000 mg | ORAL_TABLET | Freq: Once | ORAL | Status: AC
  Administered 2014-07-14: 20 mg via ORAL
  Filled 2014-07-14: qty 1

## 2014-07-14 NOTE — ED Notes (Signed)
Pt reports intermittent abdominal pain x3 weeks with diarrhea and nausea, denies vomiting. Today pt woke up and pt felt awful, had pain under ribs and abdomen, sweating, pain 10/10. Pt had large bowel movement, which relieved some of pain, but pt reports she still does not feel good. Hx of appendectomy.

## 2014-07-14 NOTE — ED Provider Notes (Signed)
CSN: [id number]     Arrival date & time 07/14/14  0813 History   First MD Initiated Contact with Patient 07/14/14 [phone]     Chief Complaint  Patient presents with  . Abdominal Pain  . Diarrhea     (Consider location/radiation/quality/duration/timing/severity/associated sxs/prior Treatment) HPI   Katie Gentry is a 28 y.o. female complaining of epigastric abdominal pain associated with loose stool onset 3 weeks ago. Patient had one bowel movement yesterday. Patient denies melena, hematochezia, emesis, fever, chills, recent antibiotic use rates her abdominal pain at 5 out of 10, described as aching, no pain medication taken prior to arrival, no history of excessive NSAID use. Patient does have past medical history of GERD she stopped taking omeprazole 6 months ago at primary care physician's advice.   Past Medical History  Diagnosis Date  . GERD (gastroesophageal reflux disease)    Past Surgical History  Procedure Laterality Date  . Appendectomy     History reviewed. No pertinent family history. History  Substance Use Topics  . Smoking status: Current Every Day Smoker -- 0.50 packs/day for 10 years    Types: Cigarettes  . Smokeless tobacco: Not on file  . Alcohol Use: No   OB History    No data available     Review of Systems  10 systems reviewed and found to be negative, except as noted in the HPI.   Allergies  Peanuts; Penicillins; and Latex  Home Medications   Prior to Admission medications   Medication Sig Start Date End Date Taking? Authorizing Provider  buPROPion (WELLBUTRIN SR) 150 MG 12 hr tablet Take 150 mg by mouth 2 (two) times daily.  05/15/14  Yes Historical Provider, MD  ibuprofen (ADVIL,MOTRIN) 200 MG tablet Take 400-600 mg by mouth every 6 (six) hours as needed for headache.   Yes Historical Provider, MD  Multiple Vitamin (MULTIVITAMIN WITH MINERALS) TABS tablet Take 1 tablet by mouth daily.   Yes Historical Provider, MD  ORTHO TRI-CYCLEN LO  0.18/0.215/0.25 MG-25 MCG tab Take 1 tablet by mouth daily.  05/13/14  Yes Historical Provider, MD   BP 120/80 mmHg  Pulse 83  Temp(Src) 98.3 F (36.8 C) (Oral)  Resp 16  SpO2 99%  LMP 07/07/2014 (Exact Date) Physical Exam  Constitutional: She is oriented to person, place, and time. She appears well-developed and well-nourished. No distress.  HENT:  Head: Normocephalic and atraumatic.  Mouth/Throat: Oropharynx is clear and moist.  Eyes: Conjunctivae and EOM are normal. Pupils are equal, round, and reactive to light.  Neck: Normal range of motion.  Cardiovascular: Normal rate, regular rhythm and intact distal pulses.   Pulmonary/Chest: Effort normal and breath sounds normal. No stridor. No respiratory distress. She has no wheezes. She has no rales. She exhibits no tenderness.  Abdominal: Soft. Bowel sounds are normal. She exhibits no distension and no mass. There is no tenderness. There is no rebound and no guarding.  Musculoskeletal: Normal range of motion.  Neurological: She is alert and oriented to person, place, and time.  Psychiatric: She has a normal mood and affect.  Nursing note and vitals reviewed.   ED Course  Procedures (including critical care time) Labs Review Labs Reviewed  COMPREHENSIVE METABOLIC PANEL - Abnormal; Notable for the following:    AST 39 (*)    ALT 38 (*)    Total Bilirubin <0.2 (*)    All other components within normal limits  CBC WITH DIFFERENTIAL  LIPASE, BLOOD  URINALYSIS, ROUTINE W REFLEX MICROSCOPIC  POC URINE  PREG, ED  I-STAT BETA HCG BLOOD, ED ([facility], [facility], [facility] ONLY)    Imaging Review No results found.   EKG Interpretation None      MDM   Final diagnoses:  Epigastric pain  Transaminitis    Filed Vitals:   07/14/14 0824 07/14/14 1053  BP: 134/85 120/80  Pulse: 100 83  Temp: 97.9 F (36.6 C) 98.3 F (36.8 C)  TempSrc: Oral Oral  Resp: 16 16  SpO2: 100% 99%    Medications  famotidine (PEPCID) tablet 20 mg (20 mg Oral Given  07/14/14 0907)  ondansetron (ZOFRAN-ODT) disintegrating tablet 4 mg (4 mg Oral Given 07/14/14 0907)    Katie Gentry is a pleasant 28 y.o. female presenting with  diffuse abdominal pain with the patient describes as diarrhea going on for 3 weeks. Serial abdominal exams are benign. Blood Work and urinalysis reassuring. Pain is improved in the ED. I've advised the patient to resume taking her antacids given her a resource guide and discussed return to ED precautions.  Evaluation does not show pathology that would require ongoing emergent intervention or inpatient treatment. Pt is hemodynamically stable and mentating appropriately. Discussed findings and plan with patient/guardian, who agrees with care plan. All questions answered. Return precautions discussed and outpatient follow up given.      Wynetta Emeryicole Maedell Hedger, PA-C 07/14/14 1533  Mirian MoMatthew Gentry, MD 07/15/14 [phone]

## 2014-07-14 NOTE — ED Notes (Signed)
MD at bedside.  EDPA  

## 2014-07-14 NOTE — Discharge Instructions (Signed)
Please restart your over-the-counter omeprazole.  Do not hesitate to return to the emergency room for any new, worsening or concerning symptoms.  Please obtain primary care using resource guide below. But the minute you were seen in the emergency room and that they will need to obtain records for further outpatient management.    Emergency Department Resource Guide 1) Find a Doctor and Pay Out of Pocket Although you won't have to find out who is covered by your insurance plan, it is a good idea to ask around and get recommendations. You will then need to call the office and see if the doctor you have chosen will accept you as a new patient and what types of options they offer for patients who are self-pay. Some doctors offer discounts or will set up payment plans for their patients who do not have insurance, but you will need to ask so you aren't surprised when you get to your appointment.  2) Contact Your Local Health Department Not all health departments have doctors that can see patients for sick visits, but many do, so it is worth a call to see if yours does. If you don't know where your local health department is, you can check in your phone book. The CDC also has a tool to help you locate your state's health department, and many state websites also have listings of all of their local health departments.  3) Find a Walk-in Clinic If your illness is not likely to be very severe or complicated, you may want to try a walk in clinic. These are popping up all over the country in pharmacies, drugstores, and shopping centers. They're usually staffed by nurse practitioners or physician assistants that have been trained to treat common illnesses and complaints. They're usually fairly quick and inexpensive. However, if you have serious medical issues or chronic medical problems, these are probably not your best option.  No Primary Care Doctor: - Call Health Connect at  [phone] - they can help you  locate a primary care doctor that  accepts your insurance, provides certain services, etc. - Physician Referral Service- [phone]  Chronic Pain Problems: Organization         Address  Phone   Notes  [facility]  [phone] Patients need to be referred by their primary care doctor.   Medication Assistance: Organization         Address  Phone   Notes  [facility] [address] [place], [place] [address] [phone] --Must be a resident of [facility] [place] -- Must have NO insurance coverage whatsoever (no Medicaid/ Medicare, etc.) -- The pt. MUST have a primary care doctor that directs their care regularly and follows them in the community   MedAssist  [phone]   [employer]  [phone]    Agencies that provide inexpensive medical care: Organization         Address  Phone   Notes  [facility]  [phone]   [facility]    [phone]   [facility] [address] [place], [place] [address] [phone]   [facility] [address], [place] [phone]   Planned Parenthood    [phone]   [facility]    [phone]   [facility]  [address], [place] Phone:  [phone], Fax:  [phone] Hours  of Operation:  9 am - 6 pm, M-F.  Also accepts Medicaid/Medicare and self-pay.  [facility] for [place] [place], [address], [place] Phone: [phone], Fax: [phone]. Hours of Operation:  8:30 am - 5:30 pm, M-F.  Also accepts Medicaid and self-pay.  [facility] [place] [address] [place] Phone: [phone]   [place], [place], [place] [phone], Ext. [phone] Mondays & Thursdays: 7-9 AM.  First 15 patients are seen on a first come, first serve basis.    [place]  Providers:  Organization         Address  Phone   Notes  [facility] [address], [place] [phone] Also accepts self-pay patients.  [facility] [address], [place]  [phone]   [place], [address], [place] [phone]   [facility] [address] [place] [phone]   Lucianne Lei [address], [place]   [phone] Only accepts [place] Access [place] patients after they have their name applied to their card.   Self-Pay (no insurance) in [facility]:  Organization         Address  Phone   Notes  Sickle Cell Patients, [facility] [place] [phone]   [facility] [place] [phone]   [facility] [place]  [place], [place], [place] [phone]   [facility]/Dr. Osei-Bonsu  [address] [place] or [address], [place] [phone] Phone number for both [place] and [place] locations is the same.  [facility] [address], [place] [phone]   [facility] [address] [place] or [address] [phone]   [facility] [address], [place] [phone], phone; [phone], fax Sees patients 1st and 3rd Saturday of every month.  Must not qualify for public or private insurance (i.e. Medicaid, Medicare, [place] Health Choice, Veterans' Benefits)  Household income should be no more than 200% of the poverty level The clinic cannot treat you if you are pregnant or think you are pregnant  Sexually transmitted diseases are not treated at the clinic.    Dental Care: Organization         Address  Phone  Notes  [facility] [place] [phone] Accepts children up to age 4 who are enrolled in [place] or [place]; pregnant women with a Medicaid card; and children who have applied for Medicaid or [place] Health Choice, but were declined, whose parents can pay a reduced fee at time of service.  [facility] Department of [facility]  [address], [place] [phone] Accepts children up to age 85 who are enrolled in [place] or [place]; pregnant women with a Medicaid card; and children who have applied for Medicaid or [place] Health Choice, but were declined, whose parents can pay a reduced fee at time of service.  [facility]  [place] [phone] Patients are seen by appointment only. Walk-ins are not accepted. [place] will see  patients 27 years of age and older. Monday - Tuesday (8am-5pm) Most Wednesdays (8:30-5pm) $30 per visit, cash only  [facility] Access PRO[facility]  [address], [facility] [phone] Patients are seen by appointment only. Walk-ins are not accepted. [facility] will see patients 27 years of age and older. One Wednesday Evening (Monthly: Volunteer Based).  $30 per visit, cash only  [employer] [facility]  [phone] for adults; Children under age 144, call Graduate Pediatric Dentistry at [phone]) [phone]. Children aged 504-14, please call [phone]) [phone] to request a pediatric application.  Dental services are provided in all areas of dental care including fillings, crowns and bridges, complete and partial dentures, implants, gum treatment, root canals, and extractions. Preventive care is also provided. Treatment is provided to both adults and children. Patients are selected via a lottery and there is often a waiting list.   [facility] [address], [place]  [phone] [email or web address]   [facility]  [address], [place], [place] [phone], Ext. [phone] Second and Fourth Thursday of each month, opens at 6:30 AM; Clinic ends at 9 AM.  Patients are seen on a first-come first-served basis, and a limited number are seen during each clinic.   [facility]  [address], [place], [place] [phone]   Eligibility Requirements You must have lived in [place], [place], or [place] counties for at least the last three months.   You cannot be eligible for state or federal sponsored National Cityhealthcare insurance, including CIGNAVeterans Administration, [place]icaid, or [employer] EntertainmentMedicare.   You generally cannot be eligible for healthcare insurance through your employer.    How to apply: Eligibility screenings are held every Tuesday and Wednesday afternoon from 1:00 pm until 4:00 pm. You do not need an appointment for the interview!  [facility] [address], [place], [place] [phone]   [facility]  [phone]   [facility]  [phone]    [facility]  [phone]    Behavioral Health Resources in the Community: Intensive Outpatient Programs Organization         Address  Phone  Notes  [facility] [address], [place], [place] [phone]   [facility] Outpatient [address], [place], [place] [phone]   [facility] [address], [place], [place]  [phone]   [facility] [address],  [place], [place] [phone] or [phone]   Substance Abuse Resources Organization         Address  Phone  Notes  [facility] Drug Services  [phone]   [facility]  [phone]   [facility]  [phone]   [facility]  [phone]   Residential & Outpatient Substance Abuse Program  [phone]   Psychological Services Organization         Address  Phone  Notes  [facility]  [phone]- [phone]     [facility]  [phone]   [facility] [address], [place] [phone] or [phone]    Mobile Crisis Teams Organization         Address  Phone  Notes  [facility], Mobile Crisis Care Unit  [phone]   [facility]  [address] [place], [place] [phone]   [facility] [address] [place] [phone]    Self-Help/Support Groups Organization  Address  Phone             Notes  [place]. of [place] - variety of support groups  [place] Call for more information  Narcotics Anonymous (NA), [facility] [address], [employer] [place]  2 meetings at this location   Special educational needs teacher         Address  Phone  Notes  ASAP Residential Treatment [place],    [place]  [phone]   [facility]  [address], [place] [address]9924, [place], [place]   [place], [place] [phone] Admissions: 8am-3pm M-F  Incentives Substance [place] [address],    [place], [place] [phone]   [facility] [address] [place], [place], [place]   [facility] [address],  [place], [place]   [facility]ight Programs - Intensive Outpatient [address], [place], [place]   [facility] ([place] [address] [address].,  [place], [place] [phone] or [phone]   [facility] Services (RTS) [address] [place], [place] Accepts Medicaid  [facility] [place] [address]  [place] [phone] Substance Abuse/Addiction Treatment   [facility] Organization         Address  Phone  Notes  [facility]  [phone]   Domenic Schwab, PhD [address] Arlis Porta [place], [place]   [phone] or [phone]    [place], [place] [phone]   [facility] [address], [place], [place] [phone] Insurance/Medicaid/sponsorship through [facility] and Families [address], [address]                                    [place], [place] [phone] [place] [address] Lane[place], [place] [phone]    Dr. Adele Schilder  [phone]   [facility] Dept. 1) [address], [place] 2) [place] 3)  [place] [address], [place] [phone]  [phone]   [place] [phone] or [phone] (After Hours)

## 2014-07-14 NOTE — ED Notes (Signed)
NO RX GIVEN 

## 2014-08-05 ENCOUNTER — Ambulatory Visit (HOSPITAL_BASED_OUTPATIENT_CLINIC_OR_DEPARTMENT_OTHER): Payer: PRIVATE HEALTH INSURANCE

## 2015-03-04 ENCOUNTER — Other Ambulatory Visit (HOSPITAL_BASED_OUTPATIENT_CLINIC_OR_DEPARTMENT_OTHER): Payer: Self-pay | Admitting: Internal Medicine

## 2015-03-04 DIAGNOSIS — Z309 Encounter for contraceptive management, unspecified: Secondary | ICD-10-CM

## 2015-03-04 NOTE — Progress Notes (Signed)
PER Pharmacy, Sara Blair is a 29 year old female has requested a refill of  norgestimate-ethinyl estradiol triphasic (ORTHO TRI-CYCLEN LO) 0.18/0.215/0.25 MG-25 MCG per tablet  .      Last Office Visit: 02/27/2014  Last Physical Exam: 01/06/2012      Other Med Adult:  Most Recent BP Reading(s)  02/27/14 : 124/70          CHOLESTEROL (mg/dl)   Date Value   63/07/6008 201 (H)   ----------    LOW DENSITY LIPOPROTEIN DIRECT (mg/dl)   Date Value   93/23/5573 124 (H)   ----------    HIGH DENSITY LIPOPROTEIN (mg/dl)   Date Value   22/08/5425 52   ----------    TRIGLYCERIDES (mg/dl)   Date Value   01/15/7627 80   ----------        THYROID SCREEN TSH REFLEX FT4 (uIU/mL)   Date Value   02/27/2014 1.990   ----------      No results found for: TSH    No results found for: HGBA1C        INR (no units)   Date Value   12/05/2012 < 1.0 (L)   ----------       Documented patient preferred pharmacies:    [facility] - [place], [place] - [address] AT [facility] [address]  Phone: [phone] Fax: [phone]    [facility] [address] - [place], [place] - [address] AT [facility] OF [address]  Phone: [phone] Fax: [phone]    [facility] [address] - [place], [place] - [address] AT [facility] & [address]  Phone: [phone] Fax: [phone]    [facility] - [place], [place] - [address] AT [facility] [address]  Phone: [phone] Fax: [phone]    [facility], [place] - [address] AT [facility] [address]  Phone: [phone] Fax: [phone]

## 2015-03-16 ENCOUNTER — Other Ambulatory Visit (HOSPITAL_BASED_OUTPATIENT_CLINIC_OR_DEPARTMENT_OTHER): Payer: Self-pay | Admitting: Internal Medicine

## 2015-03-16 DIAGNOSIS — Z309 Encounter for contraceptive management, unspecified: Secondary | ICD-10-CM

## 2015-03-16 NOTE — Progress Notes (Signed)
I believe [place] has moved to [place] and that I am no longer her PMD. If this is not the case please let me know and I will refill until she can be seen by me in f[place].

## 2015-03-16 NOTE — Progress Notes (Signed)
PER Pharmacy, Sara Blair is a 29 year old female has requested a refill of BUPROPION SR  12HR TABLET AND TRINESSA LO TABLET.      Last Office Visit: 02/27/2014  Last Physical Exam: 01/06/2012      Other Med Adult:  Most Recent BP Reading(s)  02/27/14 : 124/70          CHOLESTEROL (mg/dl)   Date Value   09/81/1914 201 (H)   ----------    LOW DENSITY LIPOPROTEIN DIRECT (mg/dl)   Date Value   78/29/5621 124 (H)   ----------    HIGH DENSITY LIPOPROTEIN (mg/dl)   Date Value   30/86/5784 52   ----------    TRIGLYCERIDES (mg/dl)   Date Value   69/62/9528 80   ----------        THYROID SCREEN TSH REFLEX FT4 (uIU/mL)   Date Value   02/27/2014 1.990   ----------      No results found for: TSH    No results found for: HGBA1C        INR (no units)   Date Value   12/05/2012 < 1.0 (L)   ----------       Documented patient preferred pharmacies:    [facility] - [place], [place] - [address] AT [facility] [address]  Phone: [phone] Fax: [phone]

## 2015-03-19 NOTE — Progress Notes (Signed)
Will refill for the next year. Further refills beyond this will require an office visit.

## 2015-03-20 NOTE — Progress Notes (Signed)
Tel phone call to patient, informing of her medication was refilled. And also she is due for PE to call [facility] to book an appt with PCP.

## 2015-04-27 ENCOUNTER — Encounter (HOSPITAL_BASED_OUTPATIENT_CLINIC_OR_DEPARTMENT_OTHER): Payer: Self-pay

## 2015-04-27 ENCOUNTER — Emergency Department (HOSPITAL_BASED_OUTPATIENT_CLINIC_OR_DEPARTMENT_OTHER): Payer: Medicaid - Out of State

## 2015-04-27 ENCOUNTER — Emergency Department (HOSPITAL_BASED_OUTPATIENT_CLINIC_OR_DEPARTMENT_OTHER): Payer: Self-pay

## 2015-04-27 ENCOUNTER — Emergency Department (HOSPITAL_BASED_OUTPATIENT_CLINIC_OR_DEPARTMENT_OTHER)
Admission: EM | Admit: 2015-04-27 | Discharge: 2015-04-27 | Disposition: A | Discharge status: Home / Self Care | Payer: Self-pay | Attending: Emergency Medicine | Admitting: Emergency Medicine

## 2015-04-27 DIAGNOSIS — M79631 Pain in right forearm: Secondary | ICD-10-CM | POA: Insufficient documentation

## 2015-04-27 DIAGNOSIS — M7989 Other specified soft tissue disorders: Secondary | ICD-10-CM | POA: Insufficient documentation

## 2015-04-27 DIAGNOSIS — F1120 Opioid dependence, uncomplicated: Secondary | ICD-10-CM | POA: Insufficient documentation

## 2015-04-27 DIAGNOSIS — Z8719 Personal history of other diseases of the digestive system: Secondary | ICD-10-CM | POA: Insufficient documentation

## 2015-04-27 DIAGNOSIS — F1910 Other psychoactive substance abuse, uncomplicated: Secondary | ICD-10-CM

## 2015-04-27 DIAGNOSIS — Z91040 Latex allergy status: Secondary | ICD-10-CM | POA: Insufficient documentation

## 2015-04-27 DIAGNOSIS — Z880 Allergy status to penicillin: Secondary | ICD-10-CM | POA: Insufficient documentation

## 2015-04-27 DIAGNOSIS — I809 Phlebitis and thrombophlebitis of unspecified site: Secondary | ICD-10-CM

## 2015-04-27 DIAGNOSIS — Z8619 Personal history of other infectious and parasitic diseases: Secondary | ICD-10-CM | POA: Insufficient documentation

## 2015-04-27 DIAGNOSIS — Z79899 Other long term (current) drug therapy: Secondary | ICD-10-CM | POA: Insufficient documentation

## 2015-04-27 DIAGNOSIS — Z720 Tobacco use: Secondary | ICD-10-CM | POA: Insufficient documentation

## 2015-04-27 DIAGNOSIS — Z23 Encounter for immunization: Secondary | ICD-10-CM | POA: Insufficient documentation

## 2015-04-27 DIAGNOSIS — I8000 Phlebitis and thrombophlebitis of superficial vessels of unspecified lower extremity: Secondary | ICD-10-CM | POA: Insufficient documentation

## 2015-04-27 HISTORY — DX: Unspecified viral hepatitis C without hepatic coma: B19.20

## 2015-04-27 HISTORY — DX: Other psychoactive substance abuse, uncomplicated: F19.10

## 2015-04-27 MED ORDER — SULFAMETHOXAZOLE-TRIMETHOPRIM 800-160 MG PO TABS
1.0000 | ORAL_TABLET | Freq: Once | ORAL | Status: AC
  Administered 2015-04-27: 1 via ORAL
  Filled 2015-04-27: qty 1

## 2015-04-27 MED ORDER — TETANUS-DIPHTH-ACELL PERTUSSIS 5-2.5-18.5 LF-MCG/0.5 IM SUSP
0.5000 mL | Freq: Once | INTRAMUSCULAR | Status: AC
  Administered 2015-04-27: 0.5 mL via INTRAMUSCULAR
  Filled 2015-04-27: qty 0.5

## 2015-04-27 MED ORDER — ASPIRIN 325 MG PO TABS
325.0000 mg | ORAL_TABLET | Freq: Once | ORAL | Status: AC
  Administered 2015-04-27: 325 mg via ORAL
  Filled 2015-04-27: qty 1

## 2015-04-27 MED ORDER — CEPHALEXIN 250 MG PO CAPS
500.0000 mg | ORAL_CAPSULE | Freq: Once | ORAL | Status: AC
  Administered 2015-04-27: 500 mg via ORAL
  Filled 2015-04-27: qty 2

## 2015-04-27 NOTE — Discharge Instructions (Signed)
Take one 325 mg daily for the next 10 days. Apply warm compresses to the area.  If you see signs of infection (warmth, redness, tenderness, pus, sharp increase in pain, fever, red streaking) immediately return to the emergency department.  Do not hesitate to return to the emergency room for any new, worsening or concerning symptoms.  Please obtain primary care using resource guide below. Let them know that you were seen in the emergency room and that they will need to obtain records for further outpatient management.    Emergency Department Resource Guide 1) Find a Doctor and Pay Out of Pocket Although you won't have to find out who is covered by your insurance plan, it is a good idea to ask around and get recommendations. You will then need to call the office and see if the doctor you have chosen will accept you as a new patient and what types of options they offer for patients who are self-pay. Some doctors offer discounts or will set up payment plans for their patients who do not have insurance, but you will need to ask so you aren't surprised when you get to your appointment.  2) Contact Your Local Health Department Not all health departments have doctors that can see patients for sick visits, but many do, so it is worth a call to see if yours does. If you don't know where your local health department is, you can check in your phone book. The CDC also has a tool to help you locate your state's health department, and many state websites also have listings of all of their local health departments.  3) Find a Walk-in Clinic If your illness is not likely to be very severe or complicated, you may want to try a walk in clinic. These are popping up all over the country in pharmacies, drugstores, and shopping centers. They're usually staffed by nurse practitioners or physician assistants that have been trained to treat common illnesses and complaints. They're usually fairly quick and inexpensive.  However, if you have serious medical issues or chronic medical problems, these are probably not your best option.  No Primary Care Doctor: - Call Health Connect at  [phone] - they can help you locate a primary care doctor that  accepts your insurance, provides certain services, etc. - Physician Referral Service- [phone]  Chronic Pain Problems: Organization         Address  Phone   Notes  [facility]  [phone] Patients need to be referred by their primary care doctor.   Medication Assistance: Organization         Address  Phone   Notes  [facility] Medication [facility] [address] [place], [place] [address] [phone] --Must be a resident of [facility] -- Must have NO insurance coverage whatsoever (no Medicaid/ Medicare, etc.) -- The pt. MUST have a primary care doctor that directs their care regularly and follows them in the community   [facility]  [phone]   [employer]  [phone]    Agencies that provide inexpensive medical care: Organization         Address  Phone   Notes  [facility]  [phone]   [facility]    [phone]   [facility] [address] [place], [place] [address] [phone]   [facility] [address] [place], [place] [phone]   [facility]anned Parenthood    (  [phone]   [facility]    [phone]   [facility]  [address], [place] Phone:  [phone], Fax:  [phone] Hours of Operation:  9 am - 6 pm, M-F.  Also accepts Medicaid/Medicare and self-pay.  [facility] for [place], [address], [place] Phone: [phone], Fax: [phone]. Hours of Operation:  8:30 am - 5:30 pm, M-F.  Also accepts Medicaid and self-pay.  [facility] [place] [address] [place] Phone: [phone]   [place], [place], [place] [phone], Ext. [phone] Mondays & Thursdays: 7-9 AM.  First 15 patients are seen on a first come, first serve basis.    [place] Providers:  Organization         Address  Phone   Notes  [facility] [address], [place] [phone] Also accepts self-pay patients.  [facility] [id number] [place], [place]  [phone]   [place], [address], [place] [phone]   [facility] [address], [place] [phone]   Lucianne Lei [address], [place]   [phone] Only accepts [place] Access [place] patients after they have their name applied to their card.   Self-Pay (no insurance) in [facility]:  Organization         Address  Phone   Notes  Sickle Cell Patients, [facility] Internal Medicine [place] [phone]   [facility] [place] [phone]   [facility] [place]  [place], [place], [place] [phone]   [facility]/Dr. Osei-Bonsu  [address], [place] or [address], [place] [phone] Phone number for both [place] and [place] locations is the same.  [facility] [address] [place] [phone]   [facility] [address], [place] or [address] [phone]   [facility] [address] [place] [phone], phone; [phone], fax Sees patients 1st and 3rd Saturday of every month.  Must not qualify for public or private insurance (i.e. Medicaid, Medicare, [place] Health Choice, Veterans' Benefits)  Household income should be no more than 200% of the poverty level The clinic cannot treat you if you are pregnant or think  you are pregnant  Sexually transmitted diseases are not treated at the clinic.    Dental Care: Organization         Address  Phone  Notes  [facility] of [facility] [place] [phone] Accepts children up to age 70 who are enrolled in [place] or [place]; pregnant women with a Medicaid card; and children who have applied for Medicaid or [place] Health Choice, but were declined, whose parents can pay a reduced fee at time of service.  [facility] Department of [facility]  [address], [place] [phone] Accepts children up to age 75 who are enrolled in [place] or [place]; pregnant women with a Medicaid card; and children who have applied for Medicaid or [place], but were declined, whose parents can pay a  reduced fee at time of service.  [place] Adult [facility] Access PROGRAM  [place] [phone] Patients are seen by appointment only. Walk-ins are not accepted. [place] will see patients 26 years of age and older. Monday - Tuesday (8am-5pm) Most Wednesdays (8:30-5pm) $30 per visit, cash only  [facility] Adult Dental Access PROGRAM  [address], [facility] [phone] Patients are seen by appointment only. Walk-ins are not accepted. [place] will see patients 29 years of age and older. One Wednesday Evening (Monthly: Volunteer Based).  $30 per visit, cash only  [place]  [phone] for adults; Children under age 18, call Graduate Pediatric Dentistry at [phone]. Children aged 60-14, please call [phone] to request a pediatric application.  Dental services are provided in all areas of dental care including fillings, crowns and bridges, complete and partial dentures, implants, gum treatment, root canals, and extractions. Preventive care is also provided. Treatment is provided to both adults and  children. Patients are selected via a lottery and there is often a waiting list.   [facility] [address] [place]  [phone] [email or web address]   [facility] [address] [place], [place] [phone], Ext. [phone] Second and Fourth Thursday of each month, opens at 6:30 AM; Clinic ends at 9 AM.  Patients are seen on a first-come first-served basis, and a limited number are seen during each clinic.   [facility]  [address] [place], [place] [phone]   Eligibility Requirements You must have lived in [place], [place], or [place] counties for at least the last three months.   You cannot be eligible for state or federal sponsored [employer], including [employer], [place], or [employer].   You generally cannot be eligible for healthcare insurance through your employer.    How to apply: Eligibility screenings are held every Tuesday and Wednesday afternoon from 1:00 pm until 4:00 pm. You do not need an appointment for the interview!  [facility] [address] [place], [place]   [place]  [place] Department  [place]  [phone]    Behavioral Health Resources in the Community: Intensive Outpatient Programs Organization         Address  Phone  Notes  [place]. [address] [place], [place] [phone]   [facility] Outpatient [address] [place], [place]   ADS: Alcohol & Drug Svcs [address], [place], [place]   [place] [address]  [place], [place] or [phone]   Substance Abuse Resources Organization         Address  Phone  Notes  Al[facility] Drug Services  [phone]   [place]  [phone]   [facility] [place]     Chinita Pester  [phone]   Residential & Outpatient Substance Abuse Program  [phone]   Psychological Services Organization         Address  Phone  Notes  [facility] [place]  [place]  [phone]   [place] [address] [place] or [phone]    Mobile Crisis Teams Organization         Address  Phone  Notes  [facility], Mobile Crisis Care Unit  [phone]   [facility]  [address] [place], [place] [phone]   [facility] [address] [place] [phone]    Self-Help/Support Groups Organization         Address  Phone             Notes  [facility] [place] - variety of support groups  [phone] Call for more information  Narcotics Anonymous (NA), [facility] [address], [place]  2 meetings at this location   Statisticianesidential Treatment Programs Organization         Address  Phone  Notes  [facility] [address],    [place]  [phone]   [facility]  [address], [place] [phone], [place], [place] [phone]   [facility] [address], [place] [phone] Admissions: 8am-3pm M-F  [facility] [address]    [place], [place] [phone]   [facility] [address], [place], [place] [phone]   [facility] [address]  [place], [place] [phone]   [facility] - Intensive Outpatient [address], [place], [place] [phone]   [facility] ([facility] [address]  [place], [place] [phone] or [phone]   [facility] (RT[facility]) [address] [place], [place] [phone] Accepts Medicaid  [facility] [address]  [place] [phone] Substance Abuse/Addiction Treatment   [facility]  Behavioral Health Resources Organization         Address  Phone  Notes  [facility]  [phone]) [phone]   Angie FavaJulie Brannon, PhD [address] [place], [place]   [phone](336) [phone] or [phone](336) [phone]   [facility]   [address] [place], [place] [phone]   [facility] [address], [place], [place] [phone]) [phone] Insurance/Medicaid/sponsorship through [facility]  [facility] Families [address]                                    [place], [place] [phone]) [phone] Therapy/tele-psych/case  [facility] [address]   [place], [place] [phone]    Dr. Lolly MustacheArfeen  [phone]   [facility]  [facility] 1) [address],  2) [address], [place] 3)  [address], [place] [phone]  [phone]   [facility] Child Abuse Hotline [phone](336) [phone] or [phone]) [phone] (After Hours)

## 2015-04-27 NOTE — ED Provider Notes (Signed)
CSN: [id number]     Arrival date & time 04/27/15  2020 History   First MD Initiated Contact with Patient 04/27/15 2029     Chief Complaint  Patient presents with  . Arm Problem     (Consider location/radiation/quality/duration/timing/severity/associated sxs/prior Treatment) HPI   Blood pressure 125/92, pulse 100, temperature 98.8 F (37.1 C), temperature source Oral, resp. rate 18, height  (1.626 m), weight 152 lb (68.947 kg), last menstrual period 04/13/2015, SpO2 100 %.  Katie Gentry is a 29 y.o. female complaining of hand full swelling to right antecubital area after she injected heroin with old needle which she describes as jagged on Saturday. Patient denies fever, chills, streaking up the arm. Patient states she was clean for 3 years. She states that she does not feel that the needle fracture or fragmented but she does feel that she missed the vein.  Past Medical History  Diagnosis Date  . GERD (gastroesophageal reflux disease)   . Drug abuse   . Hepatitis C    Past Surgical History  Procedure Laterality Date  . Appendectomy     No family history on file. Social History  Substance Use Topics  . Smoking status: Current Every Day Smoker -- 0.50 packs/day for 10 years    Types: Cigarettes  . Smokeless tobacco: None  . Alcohol Use: No   OB History    No data available     Review of Systems  10 systems reviewed and found to be negative, except as noted in the HPI.   Allergies  Peanuts; Penicillins; and Latex  Home Medications   Prior to Admission medications   Medication Sig Start Date End Date Taking? Authorizing Provider  buPROPion (WELLBUTRIN SR) 150 MG 12 hr tablet Take 150 mg by mouth 2 (two) times daily.  05/15/14   Historical Provider, MD  ORTHO TRI-CYCLEN LO 0.18/0.215/0.25 MG-25 MCG tab Take 1 tablet by mouth daily.  05/13/14   Historical Provider, MD   BP 107/76 mmHg  Pulse 68  Temp(Src) 98.9 F (37.2 C) (Oral)  Resp 20  Ht  (1.626 m)   Wt 152 lb (68.947 kg)  BMI 26.08 kg/m2  SpO2 99%  LMP 04/13/2015 Physical Exam  Constitutional: She is oriented to person, place, and time. She appears well-developed and well-nourished. No distress.  HENT:  Head: Normocephalic and atraumatic.  Mouth/Throat: Oropharynx is clear and moist.  Eyes: Conjunctivae and EOM are normal. Pupils are equal, round, and reactive to light.  Cardiovascular: Normal rate and regular rhythm.   Pulmonary/Chest: Effort normal and breath sounds normal. No stridor. No respiratory distress. She has no wheezes. She has no rales. She exhibits no tenderness.  Abdominal: Soft.  Musculoskeletal: Normal range of motion. She exhibits tenderness.  Right AC with no overlying cellulitis. Area of firmness along the superficial vein. No fluctuance  Neurological: She is alert and oriented to person, place, and time.  Psychiatric: She has a normal mood and affect.  Nursing note and vitals reviewed.   ED Course  Procedures (including critical care time) Labs Review Labs Reviewed - No data to display  Imaging Review Dg Forearm Right  04/27/2015   CLINICAL DATA:  Forearm pain for 2 days  EXAM: RIGHT FOREARM - 2 VIEW  COMPARISON:  None.  FINDINGS: No acute fracture.  No dislocation.  IMPRESSION: No acute bony pathology.   Electronically Signed   By: Jolaine Click M.D.   On: 04/27/2015 21:32   US Venous Img Upper Uni Right  04/27/2015   CLINICAL DATA:  Acute onset of right forearm swelling and pain after IV drug abuse. Initial encounter.  EXAM: RIGHT UPPER EXTREMITY VENOUS DOPPLER ULTRASOUND  TECHNIQUE: Gray-scale sonography with graded compression, as well as color Doppler and duplex ultrasound were performed to evaluate the upper extremity deep venous system from the level of the subclavian vein and including the jugular, axillary, basilic, radial, ulnar and upper cephalic vein. Spectral Doppler was utilized to evaluate flow at rest and with distal augmentation maneuvers.   COMPARISON:  None.  FINDINGS: Contralateral Subclavian Vein: Respiratory phasicity is normal and symmetric with the symptomatic side. No evidence of thrombus. Normal compressibility.  Internal Jugular Vein: No evidence of thrombus. Normal compressibility, respiratory phasicity and response to augmentation.  Subclavian Vein: No evidence of thrombus. Normal compressibility, respiratory phasicity and response to augmentation.  Axillary Vein: No evidence of thrombus. Normal compressibility, respiratory phasicity and response to augmentation.  Cephalic Vein: Not visualized on this study.  Basilic Vein: No evidence of thrombus. Normal compressibility, respiratory phasicity and response to augmentation.  Brachial Veins: No evidence of thrombus. Normal compressibility, respiratory phasicity and response to augmentation.  Radial Veins: No evidence of thrombus. Normal compressibility, respiratory phasicity and response to augmentation.  Ulnar Veins: No evidence of thrombus. Normal compressibility, respiratory phasicity and response to augmentation.  Venous Reflux:  None visualized.  Other Findings: Note is made of thrombosis within a superficial vein at the right antecubital fossa. The clot resolves approximately 3 cm before the vein's junction with the basilic vein.  IMPRESSION: 1. No evidence of deep venous thrombosis. 2. Superficial thrombus noted within a vein at the right antecubital fossa. This resolves approximately 3 cm before the vein's junction with the basilic vein. These results were called by telephone at the time of interpretation on 04/27/2015 at 10:44 pm to Dr. Wynetta Emery, who verbally acknowledged these results.   Electronically Signed   By: Roanna Raider M.D.   On: 04/27/2015 22:45   I have personally reviewed and evaluated these images and lab results as part of my medical decision-making.   EKG Interpretation None      MDM   Final diagnoses:  Pain and swelling of forearm, right   Superficial thrombophlebitis  IV drug abuse  Uncomplicated opioid dependence (HCC)    Filed Vitals:   04/27/15 2026 04/27/15 2251  BP: 125/92 107/76  Pulse: 100 68  Temp: 98.8 F (37.1 C) 98.9 F (37.2 C)  TempSrc: Oral Oral  Resp: 18 20  Height:  (1.626 m)   Weight: 152 lb (68.947 kg)   SpO2: 100% 99%    Medications  sulfamethoxazole-trimethoprim (BACTRIM DS,SEPTRA DS) 800-160 MG per tablet 1 tablet (1 tablet Oral Given 04/27/15 2108)  cephALEXin (KEFLEX) capsule 500 mg (500 mg Oral Given 04/27/15 2108)  Tdap (BOOSTRIX) injection 0.5 mL (0.5 mLs Intramuscular Given 04/27/15 2109)  aspirin tablet 325 mg (325 mg Oral Given 04/27/15 2250)    Huntleigh Doolen is a pleasant 29 y.o. female presenting with right antecubital area where patient injected heroin, thinks that she missed the vein. There is no overt signs of infection. She is afebrile, this does not appear to be an abscess. Patient is tender to palpation along the superficial vein. This likely a superficial thrombophlebitis. X-ray with no foreign body. Radiology report from Dr. Cherly Hensen appreciated: States that there is a superficial thrombus in the right antecubital fossa. Patient will be given full dose aspirin and advised warm compresses. We've had an extensive discussion  of return precautions for worsening infection and patient is given a resource guide for detox centers.  Evaluation does not show pathology that would require ongoing emergent intervention or inpatient treatment. Pt is hemodynamically stable and mentating appropriately. Discussed findings and plan with patient/guardian, who agrees with care plan. All questions answered. Return precautions discussed and outpatient follow up given.    Wynetta Emery, PA-C 04/27/15 2256  Gwyneth Sprout, MD 04/30/15 [phone]

## 2015-04-27 NOTE — ED Notes (Signed)
Patient transported to X-ray 

## 2015-04-27 NOTE — ED Notes (Signed)
Pt c/o pain, redness to right AC after using IV drugs with an old needle Saturday

## 2015-10-07 IMAGING — US US EXTREM  UP VENOUS*R*
1 series · 13 of 24 positions shown · non-contrast
Comparison: None.

CLINICAL DATA: Acute onset of right forearm swelling and pain after
IV drug abuse. Initial encounter.



[Series 1: us extrem up venous*right* · 32 acquisitions, 13 frames shown]
[im 1/32]
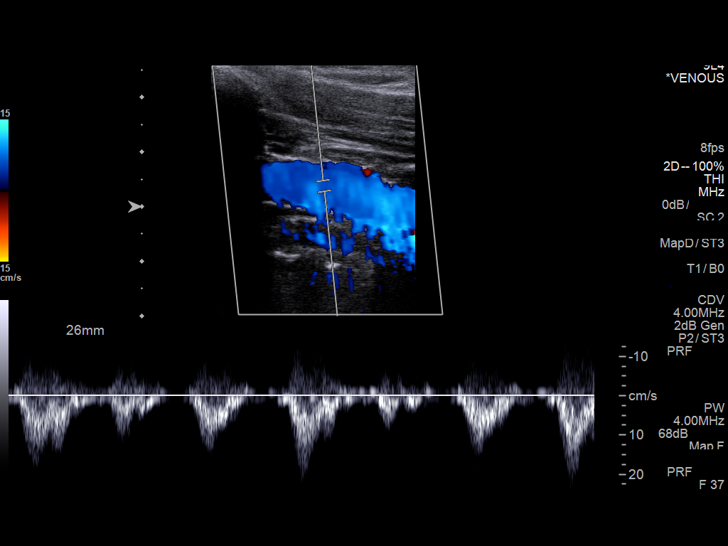
[im 3/32]
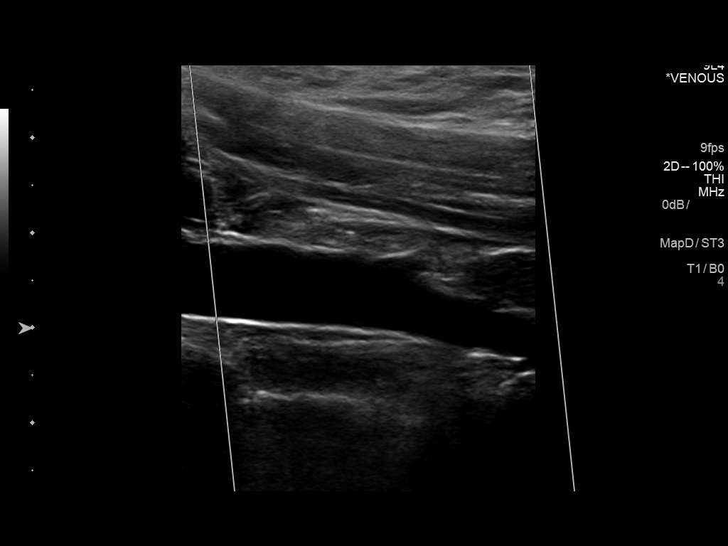
[im 6/32]
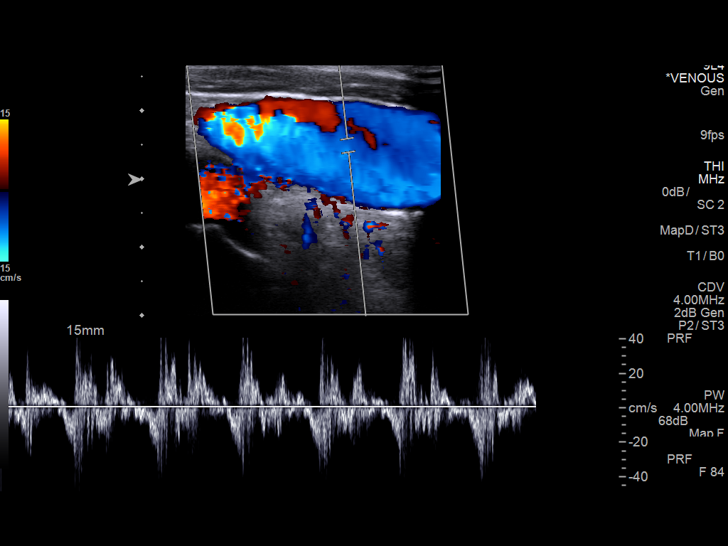
[im 9/32]
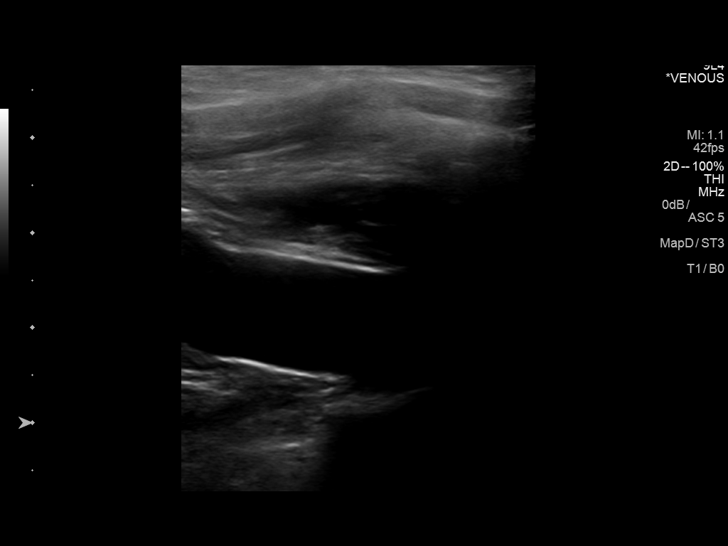
[im 11/32]
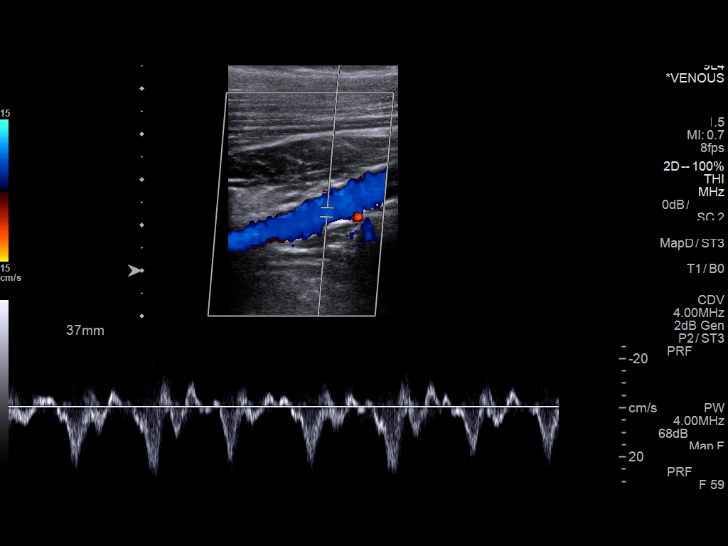
[im 14/32]
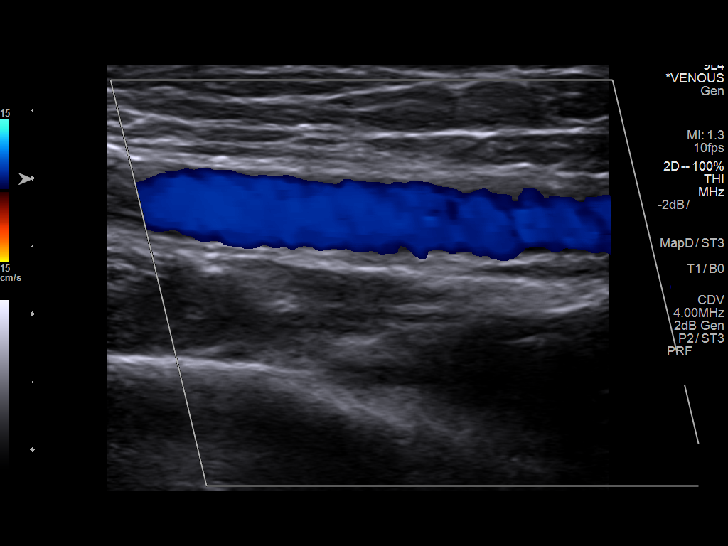
[im 18/32]
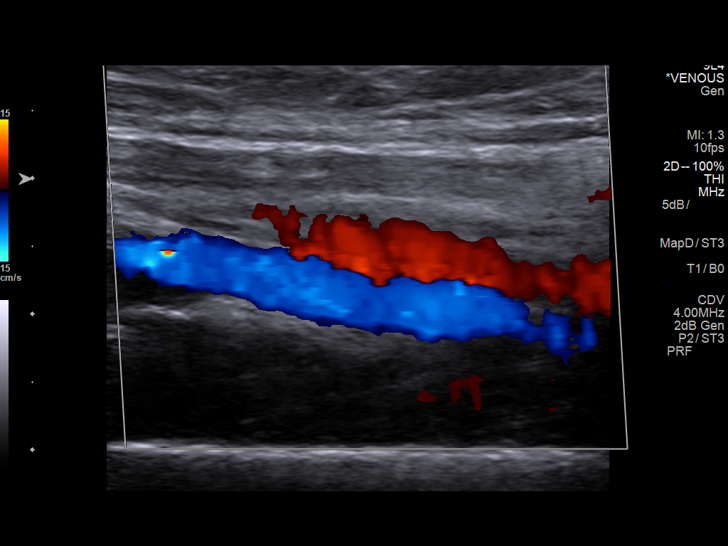
[im 19/32]
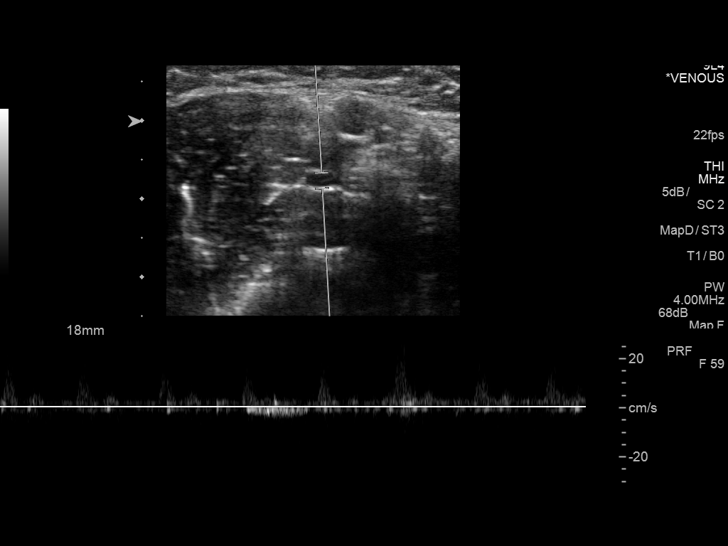
[im 22/32]
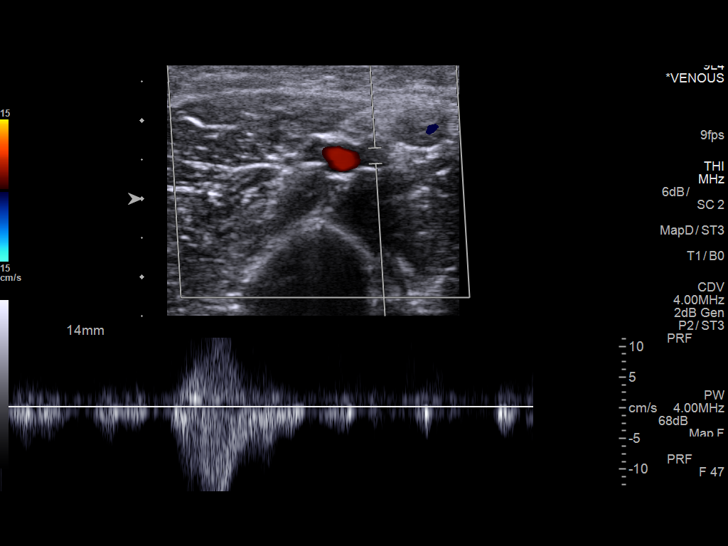
[im 25/32]
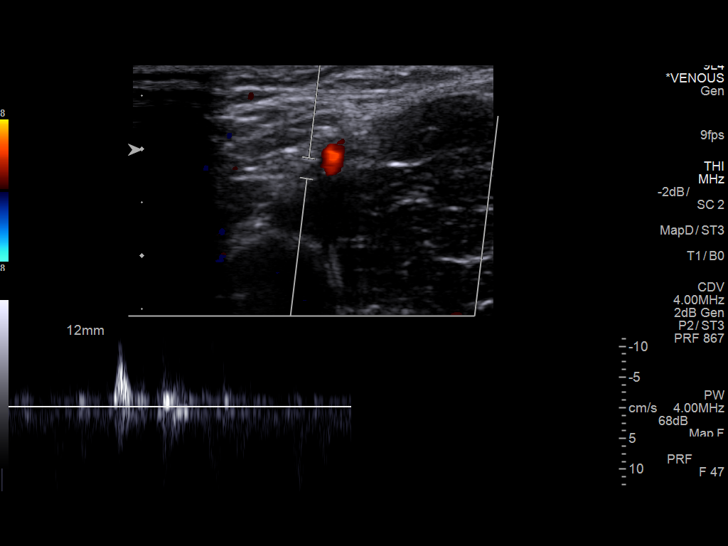
[im 27/32]
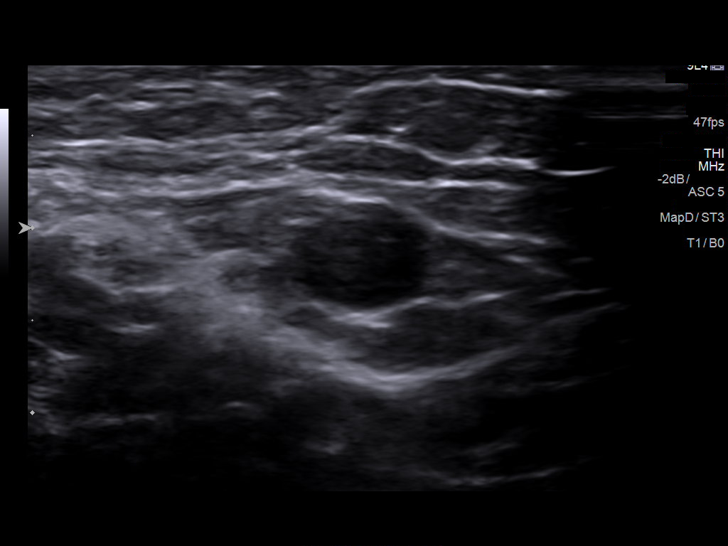
[im 29/32]
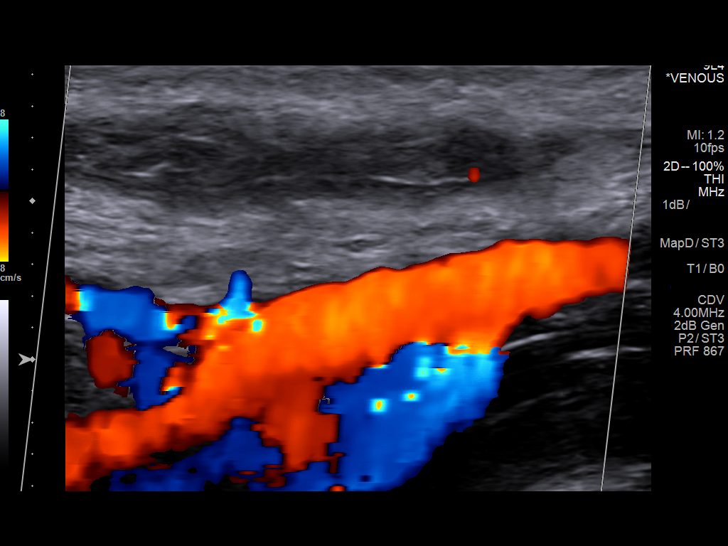
[im 32/32]
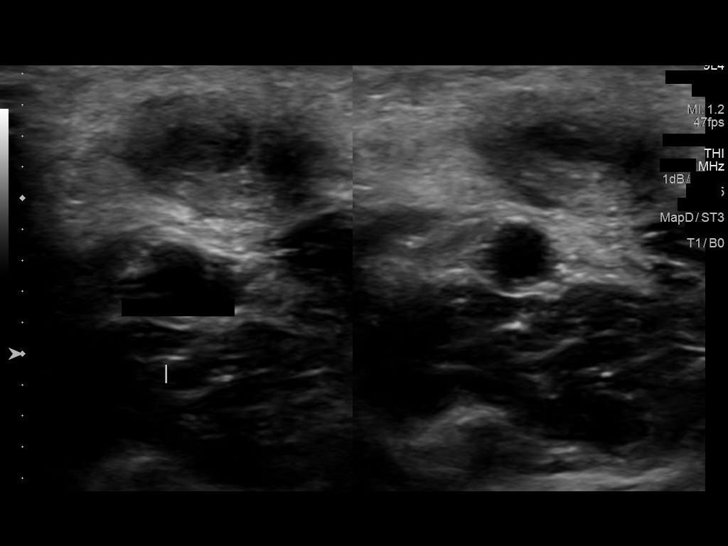

[13 of 24 positions shown; findings below may reference images not displayed]

FINDINGS: Contralateral Subclavian Vein: Respiratory phasicity is normal and
symmetric with the symptomatic side. No evidence of thrombus. Normal
compressibility.

Internal Jugular Vein: No evidence of thrombus. Normal
compressibility, respiratory phasicity and response to augmentation.

Subclavian Vein: No evidence of thrombus. Normal compressibility,
respiratory phasicity and response to augmentation.

Axillary Vein: No evidence of thrombus. Normal compressibility,
respiratory phasicity and response to augmentation.

Cephalic Vein: Not visualized on this study.

Basilic Vein: No evidence of thrombus. Normal compressibility,
respiratory phasicity and response to augmentation.

Brachial Veins: No evidence of thrombus. Normal compressibility,
respiratory phasicity and response to augmentation.

Radial Veins: No evidence of thrombus. Normal compressibility,
respiratory phasicity and response to augmentation.

Ulnar Veins: No evidence of thrombus. Normal compressibility,
respiratory phasicity and response to augmentation.

Venous Reflux:  None visualized.

Other Findings: Note is made of thrombosis within a superficial vein
at the right antecubital fossa. The clot resolves approximately 3 cm
before the vein's junction with the basilic vein.
IMPRESSION: 1. No evidence of deep venous thrombosis.
2. Superficial thrombus noted within a vein at the right antecubital
fossa. This resolves approximately 3 cm before the vein's junction
with the basilic vein.
These results were called by telephone at the time of interpretation
on 04/27/2015 at [DATE] to Dr. BIBEK HUNSAKER, who verbally
acknowledged these results.

## 2015-11-25 ENCOUNTER — Telehealth (HOSPITAL_BASED_OUTPATIENT_CLINIC_OR_DEPARTMENT_OTHER): Payer: Self-pay | Admitting: Registered Nurse

## 2015-11-25 NOTE — Progress Notes (Signed)
Pt called to ask if she needed folllow up s/p completion of Hep C tx.  Last Hep C pcr 0  No need for follow up, discussed risk factors for re-infection.  Encouraged pt to call clinic with any questions/ concerns.

## 2015-11-25 NOTE — Telephone Encounter (Signed)
-----   Message from Lind Guestindy Herrera sent at 11/25/2015  9:12 AM EDT -----  Regarding: questions regarding treatment  [facility]    Sara Blair [phone], 30 year old, female, Telephone Information:  Home Phone      [phone]  Work Phone      Not on file.  Mobile          [phone]      Patient's Preferred Pharmacy:     [facility] [id number] - [place], [place] - [address] AT [facility] [address]  Phone: [phone] Fax: [phone]    [facility] [id number] - [place], [place] - [address] AT [facility] [address]  Phone: [phone] Fax: [phone]    [facility] [id number] - [place], [place] - [address]  Phone: [phone] Fax: [phone]    [facility] [id number] - [place], [place] - [address] [facility] [address]  Phone: [phone] Fax: [phone]    [facility] [id number] - [address] [place] - [address] [facility] [address]  Phone: [phone] Fax: [phone]      CONFIRMED TODAY: No    CALL BACK NUMBER: [phone]  Best time to call back: anytime  Cell phone:   Other phone:    Available times:    Patient's language of care: English    Patient does not need an interpreter.    Patient's PCP: Alton RevereSTAHL,BONNI MD    Person calling on behalf of patient: Patient (self)    Calls today to speak to nurse only. Patient finished treatment about a year ago and was wondering if she needs to come back for a follow up.

## 2015-12-25 ENCOUNTER — Ambulatory Visit (HOSPITAL_BASED_OUTPATIENT_CLINIC_OR_DEPARTMENT_OTHER): Payer: Self-pay | Admitting: Internal Medicine

## 2016-04-12 DIAGNOSIS — F1120 Opioid dependence, uncomplicated: Secondary | ICD-10-CM | POA: Diagnosis not present

## 2016-04-19 DIAGNOSIS — Z590 Homelessness: Secondary | ICD-10-CM | POA: Diagnosis not present

## 2016-04-19 DIAGNOSIS — R010 Benign and innocent cardiac murmurs: Secondary | ICD-10-CM | POA: Diagnosis not present

## 2016-04-19 DIAGNOSIS — J4520 Mild intermittent asthma, uncomplicated: Secondary | ICD-10-CM | POA: Diagnosis not present

## 2016-04-19 DIAGNOSIS — F1120 Opioid dependence, uncomplicated: Secondary | ICD-10-CM | POA: Diagnosis not present

## 2016-05-03 DIAGNOSIS — F1120 Opioid dependence, uncomplicated: Secondary | ICD-10-CM | POA: Diagnosis not present

## 2016-05-03 DIAGNOSIS — Z590 Homelessness: Secondary | ICD-10-CM | POA: Diagnosis not present

## 2016-05-03 DIAGNOSIS — F17210 Nicotine dependence, cigarettes, uncomplicated: Secondary | ICD-10-CM | POA: Diagnosis not present

## 2016-05-03 DIAGNOSIS — R010 Benign and innocent cardiac murmurs: Secondary | ICD-10-CM | POA: Diagnosis not present

## 2016-05-25 DIAGNOSIS — F17210 Nicotine dependence, cigarettes, uncomplicated: Secondary | ICD-10-CM | POA: Diagnosis not present

## 2016-05-25 DIAGNOSIS — I340 Nonrheumatic mitral (valve) insufficiency: Secondary | ICD-10-CM | POA: Diagnosis not present

## 2016-05-25 DIAGNOSIS — Z590 Homelessness: Secondary | ICD-10-CM | POA: Diagnosis not present

## 2016-05-25 DIAGNOSIS — F1120 Opioid dependence, uncomplicated: Secondary | ICD-10-CM | POA: Diagnosis not present

## 2016-06-17 DIAGNOSIS — F1120 Opioid dependence, uncomplicated: Secondary | ICD-10-CM | POA: Diagnosis not present

## 2016-06-18 DIAGNOSIS — F1120 Opioid dependence, uncomplicated: Secondary | ICD-10-CM | POA: Diagnosis not present

## 2016-07-06 DIAGNOSIS — F1120 Opioid dependence, uncomplicated: Secondary | ICD-10-CM | POA: Diagnosis not present

## 2016-07-20 DIAGNOSIS — F1120 Opioid dependence, uncomplicated: Secondary | ICD-10-CM | POA: Diagnosis not present

## 2016-07-20 DIAGNOSIS — F1020 Alcohol dependence, uncomplicated: Secondary | ICD-10-CM | POA: Diagnosis not present

## 2017-04-14 IMAGING — DX DG FOREARM 2V*R*
2 series · 2 of 2 positions shown · non-contrast
Comparison: None.

CLINICAL DATA: Forearm pain for 2 days

EXAM:
RIGHT FOREARM - 2 VIEW

[forearm ap]
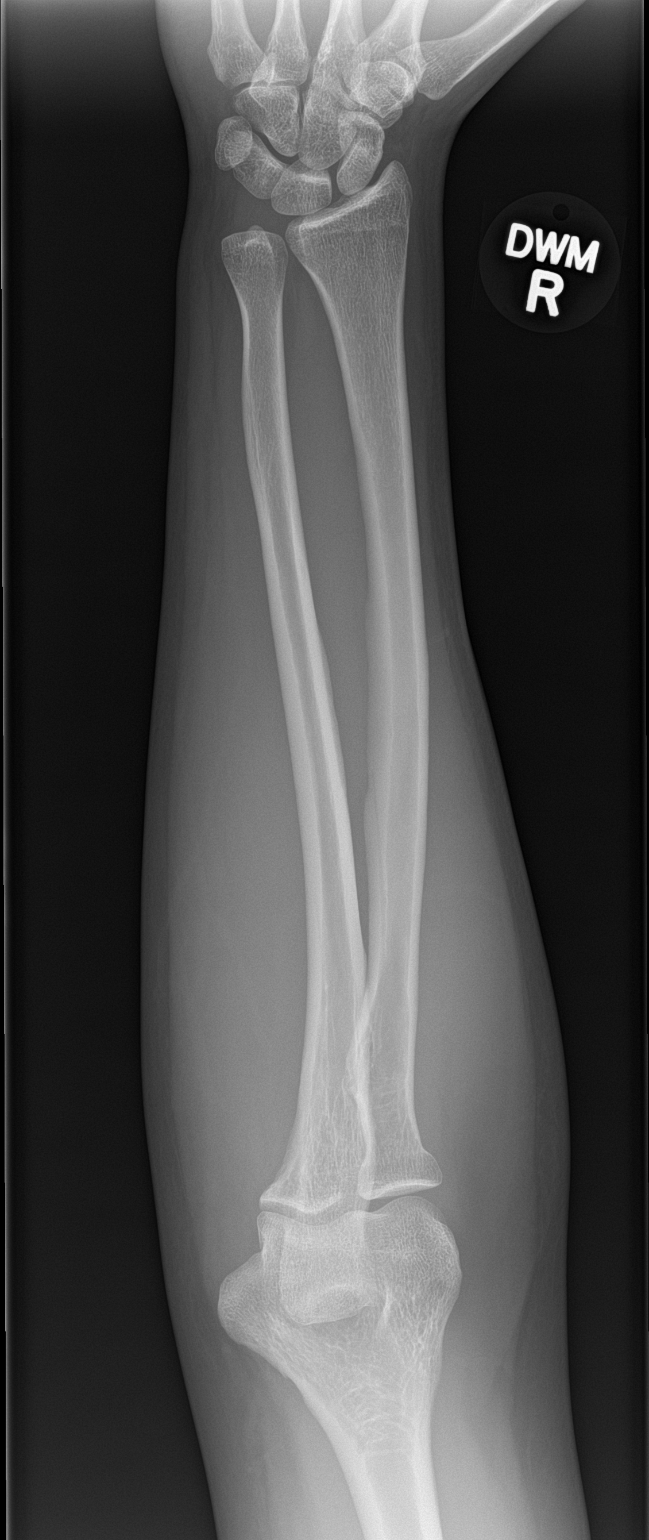

[forearm lat]
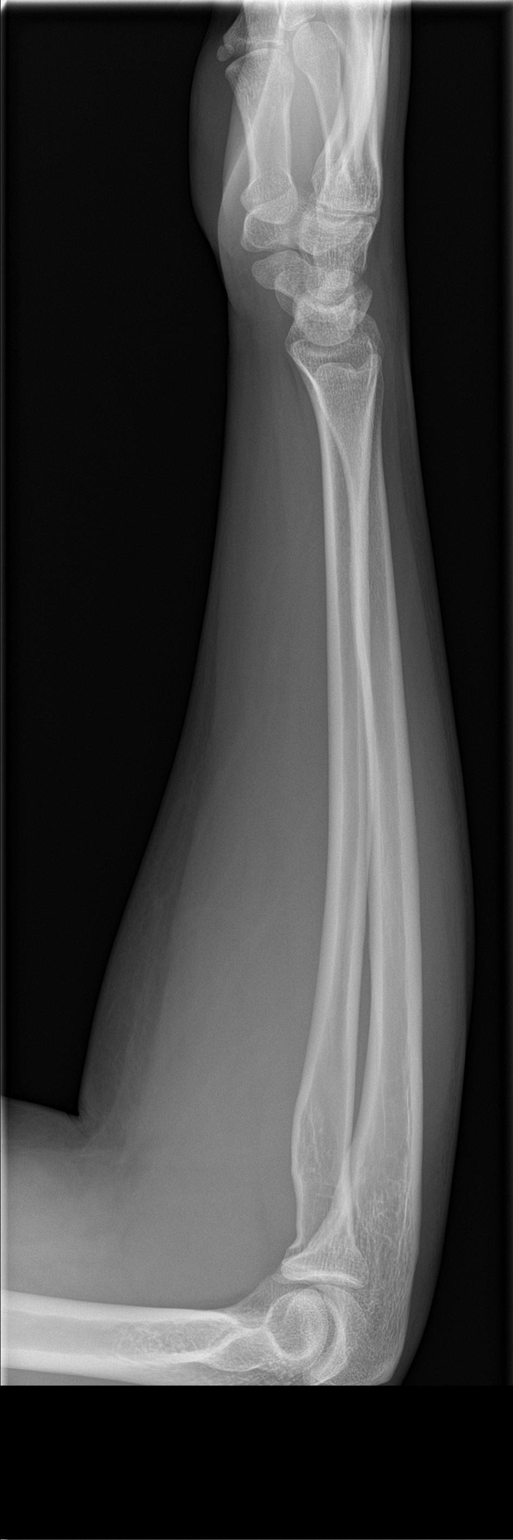

[2 of 2 positions shown; findings below may reference images not displayed]

FINDINGS: No acute fracture.  No dislocation.
IMPRESSION: No acute bony pathology.

## 2017-05-24 ENCOUNTER — Telehealth (HOSPITAL_BASED_OUTPATIENT_CLINIC_OR_DEPARTMENT_OTHER): Payer: Self-pay | Admitting: Internal Medicine

## 2017-05-24 DIAGNOSIS — N898 Other specified noninflammatory disorders of vagina: Secondary | ICD-10-CM

## 2017-05-24 NOTE — Progress Notes (Signed)
Patient was seen in urgent care on 10/30 and the urgent care office faxed us a referral request. I notified them that this pt has not been seen in quite some time so the referral may or may not be signed.

## 2017-05-25 NOTE — Progress Notes (Signed)
I am signing this referral although patient needs to make appointment to reestablish care for any future referrals.

## 2017-05-26 ENCOUNTER — Encounter (HOSPITAL_BASED_OUTPATIENT_CLINIC_OR_DEPARTMENT_OTHER): Payer: Self-pay | Admitting: Internal Medicine

## 2017-05-26 NOTE — Progress Notes (Signed)
Outreached pt a couple times with no response. The patient's number is no longer in service, so I sent a letter to let her know she needs to contact the office to re-establish care.

## 2020-10-01 ENCOUNTER — Encounter (HOSPITAL_BASED_OUTPATIENT_CLINIC_OR_DEPARTMENT_OTHER): Payer: Self-pay
